# Patient Record
Sex: Male | Born: 1955
Health system: Southern US, Community
[De-identification: ages and names within clinical notes are randomized; demographics above are authoritative.]

## PROBLEM LIST (undated history)

## (undated) DIAGNOSIS — E785 Hyperlipidemia, unspecified: Secondary | ICD-10-CM

## (undated) DIAGNOSIS — M109 Gout, unspecified: Secondary | ICD-10-CM

## (undated) DIAGNOSIS — E119 Type 2 diabetes mellitus without complications: Secondary | ICD-10-CM

## (undated) DIAGNOSIS — C029 Malignant neoplasm of tongue, unspecified: Secondary | ICD-10-CM

## (undated) DIAGNOSIS — I10 Essential (primary) hypertension: Secondary | ICD-10-CM

## (undated) DIAGNOSIS — Z973 Presence of spectacles and contact lenses: Secondary | ICD-10-CM

## (undated) HISTORY — PX: TONGUE BIOPSY: SHX1075

## (undated) HISTORY — PX: KNEE ARTHROSCOPY: SHX127

## (undated) HISTORY — PX: TONSILLECTOMY: SUR1361

## (undated) HISTORY — DX: Essential (primary) hypertension: I10

## (undated) HISTORY — DX: Gout, unspecified: M10.9

## (undated) HISTORY — DX: Hyperlipidemia, unspecified: E78.5

---

## 2004-10-31 ENCOUNTER — Ambulatory Visit (HOSPITAL_BASED_OUTPATIENT_CLINIC_OR_DEPARTMENT_OTHER): Admission: RE | Admit: 2004-10-31 | Discharge: 2004-10-31 | Payer: Self-pay | Admitting: Orthopaedic Surgery

## 2004-10-31 ENCOUNTER — Ambulatory Visit (HOSPITAL_COMMUNITY): Admission: RE | Admit: 2004-10-31 | Discharge: 2004-10-31 | Payer: Self-pay | Admitting: Orthopaedic Surgery

## 2006-09-24 ENCOUNTER — Ambulatory Visit (HOSPITAL_BASED_OUTPATIENT_CLINIC_OR_DEPARTMENT_OTHER): Admission: RE | Admit: 2006-09-24 | Discharge: 2006-09-24 | Payer: Self-pay | Admitting: Orthopaedic Surgery

## 2008-12-24 ENCOUNTER — Emergency Department (HOSPITAL_BASED_OUTPATIENT_CLINIC_OR_DEPARTMENT_OTHER): Admission: EM | Admit: 2008-12-24 | Discharge: 2008-12-24 | Payer: Self-pay | Admitting: Emergency Medicine

## 2008-12-24 ENCOUNTER — Ambulatory Visit: Payer: Self-pay | Admitting: Diagnostic Radiology

## 2009-09-13 ENCOUNTER — Encounter: Admission: RE | Admit: 2009-09-13 | Discharge: 2009-09-13 | Payer: Self-pay | Admitting: Orthopaedic Surgery

## 2010-07-23 IMAGING — CR DG CHEST 2V
2 series · 2 of 2 positions shown · non-contrast
Comparison: None

CLINICAL DATA: Fever.  Hypertension.

CHEST - 2 VIEW

[w chest pa]
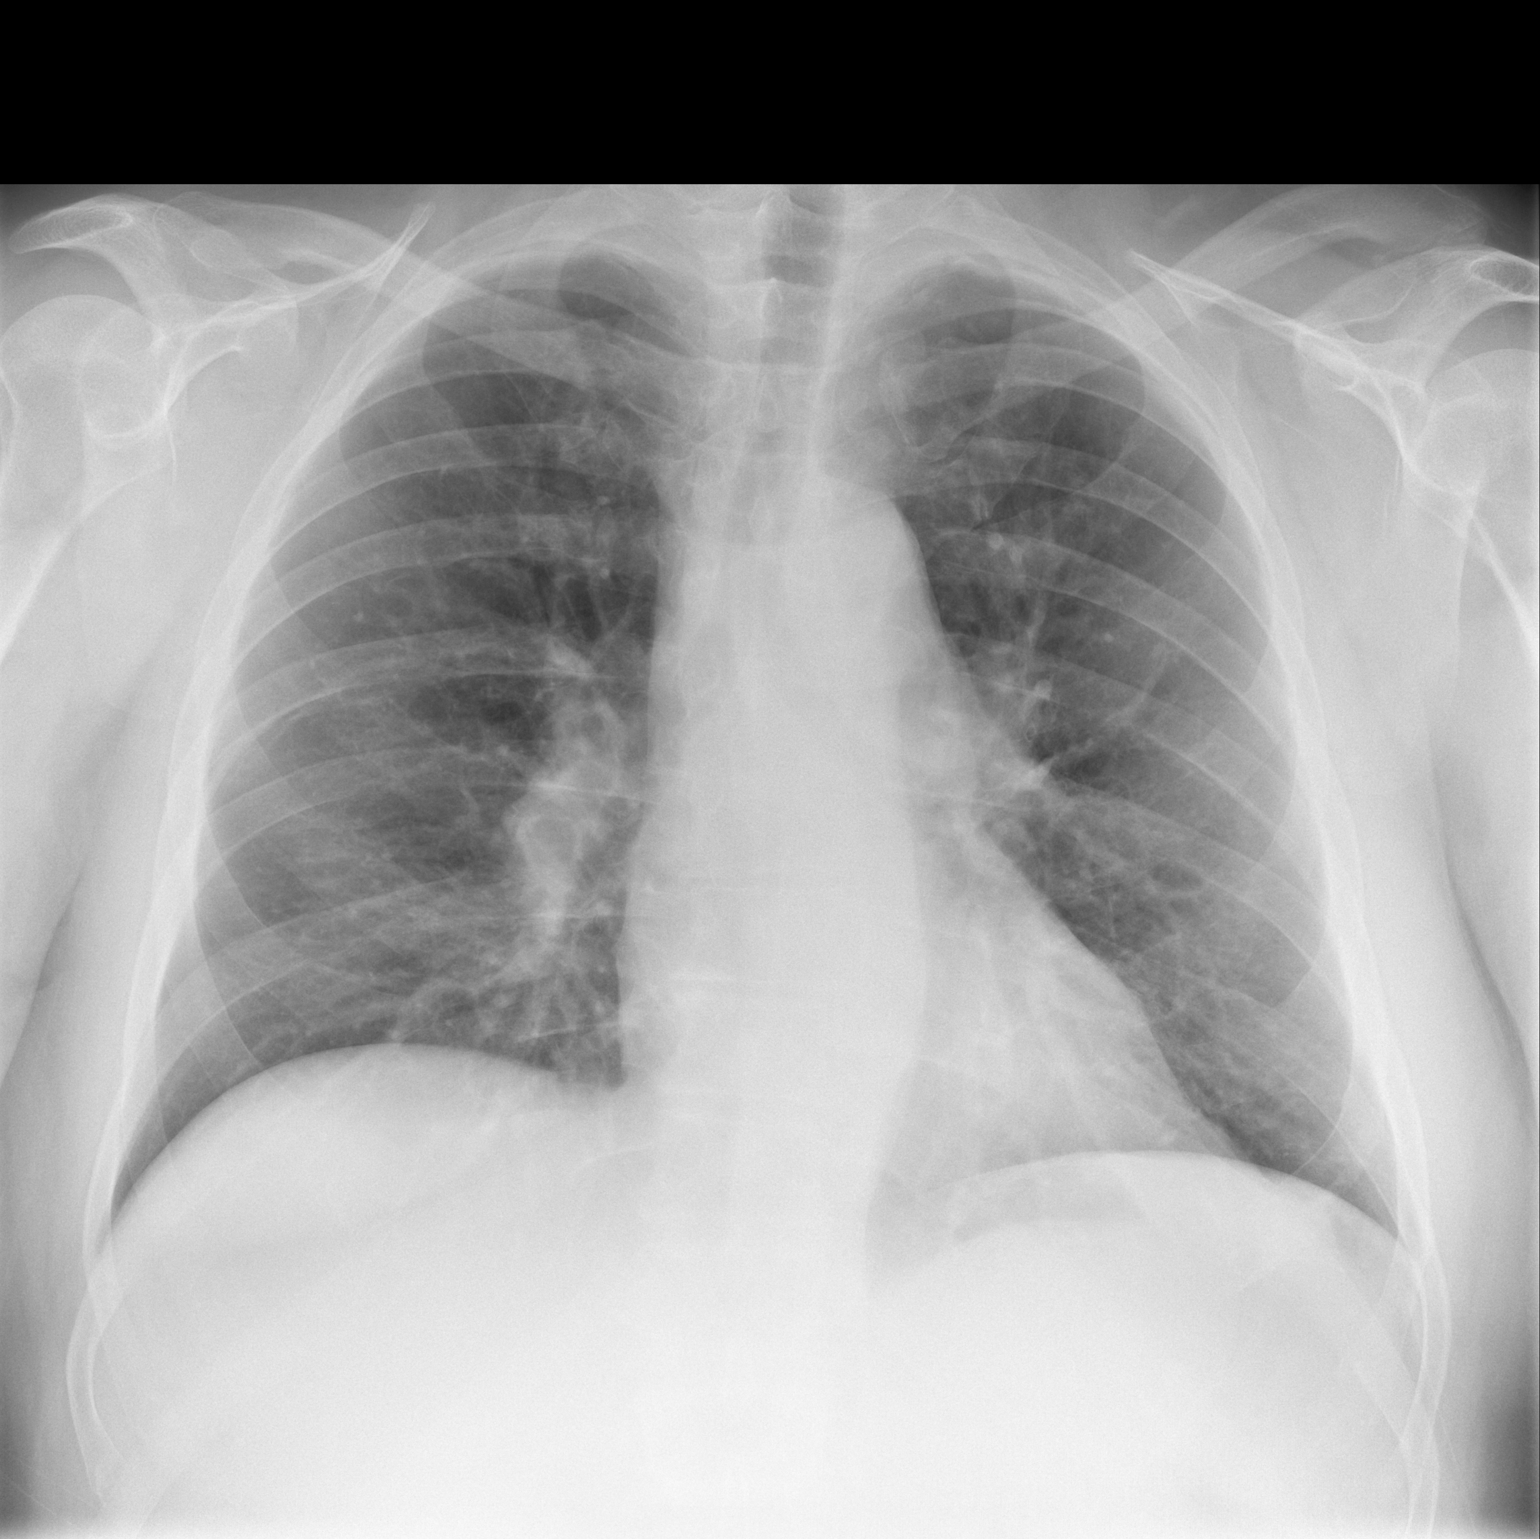

[w chest lat]
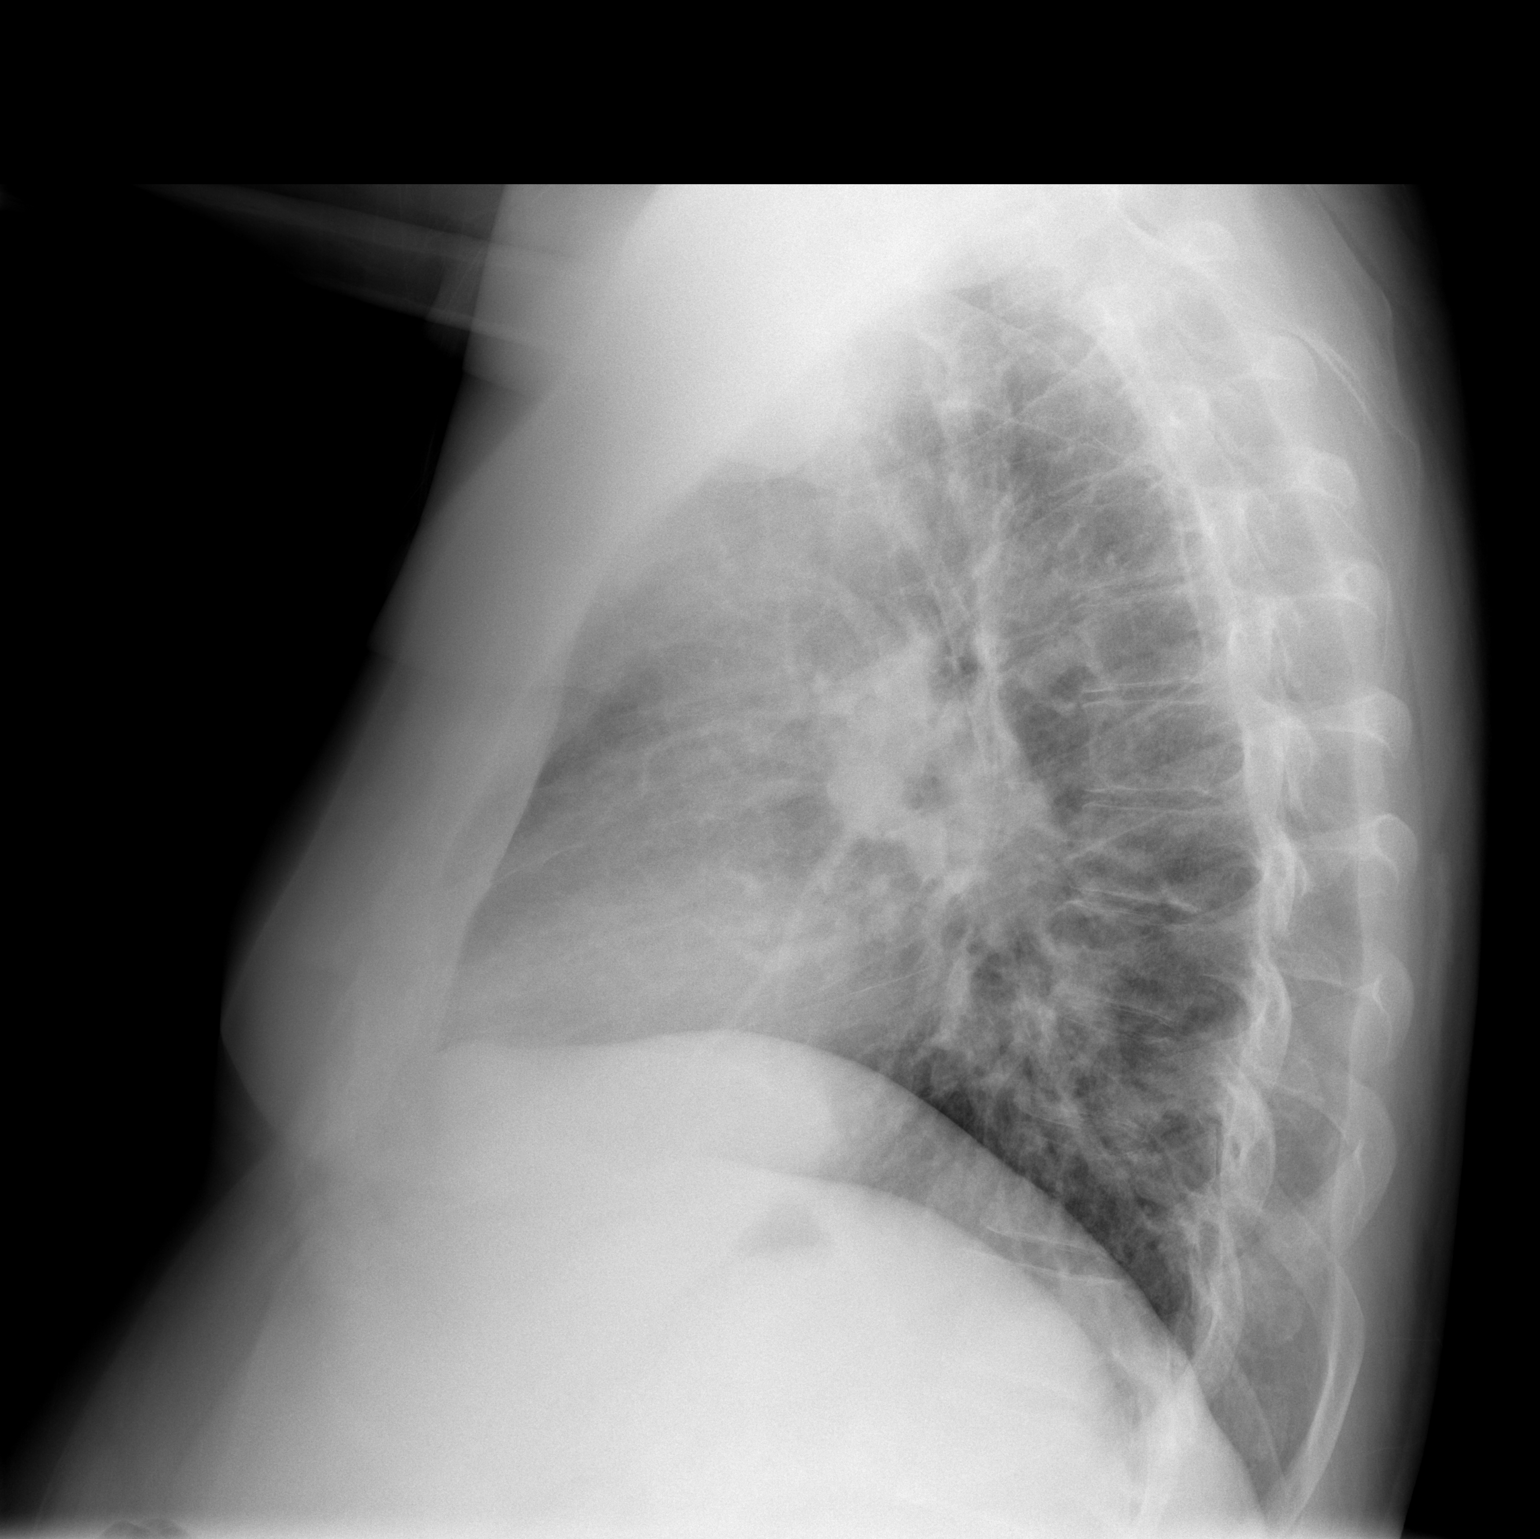

[2 of 2 positions shown; findings below may reference images not displayed]

FINDINGS: Heart size is normal.  The mediastinum is unremarkable.
Lungs are clear.  No effusions.  No significant bony finding.
IMPRESSION: Normal chest

## 2010-10-02 LAB — URINE MICROSCOPIC-ADD ON

## 2010-10-02 LAB — URINALYSIS, ROUTINE W REFLEX MICROSCOPIC
Bilirubin Urine: NEGATIVE
Glucose, UA: 100 mg/dL — AB
Nitrite: NEGATIVE
Protein, ur: 100 mg/dL — AB
Specific Gravity, Urine: 1.033 — ABNORMAL HIGH (ref 1.005–1.030)
pH: 5.5 (ref 5.0–8.0)

## 2010-10-02 LAB — BASIC METABOLIC PANEL
Glucose, Bld: 234 mg/dL — ABNORMAL HIGH (ref 70–99)
Potassium: 3.5 mEq/L (ref 3.5–5.1)
Sodium: 134 mEq/L — ABNORMAL LOW (ref 135–145)

## 2010-10-02 LAB — DIFFERENTIAL
Basophils Relative: 0 % (ref 0–1)
Lymphocytes Relative: 13 % (ref 12–46)
Lymphs Abs: 0.6 10*3/uL — ABNORMAL LOW (ref 0.7–4.0)
Monocytes Relative: 7 % (ref 3–12)
Neutro Abs: 3.2 10*3/uL (ref 1.7–7.7)

## 2010-10-02 LAB — CBC
HCT: 41.9 % (ref 39.0–52.0)
MCHC: 33.8 g/dL (ref 30.0–36.0)

## 2010-11-10 NOTE — Op Note (Signed)
[name], [name]               ACCOUNT NO.:  [id number]   MEDICAL RECORD NO.:  [id number]          PATIENT TYPE:  AMB   LOCATION:  DSC                          FACILITY:  [facility]   PHYSICIAN:  Lubertha Basque. Dalldorf, M.D.DATE OF BIRTH:  09/09/1955   DATE OF PROCEDURE:  10/31/2004  DATE OF DISCHARGE:                                 OPERATIVE REPORT   PREOPERATIVE DIAGNOSES:  1.  Right knee torn medial meniscus.  2.  Right knee degenerative joint disease.   POSTOPERATIVE DIAGNOSES:  1.  Right knee torn medial meniscus.  2.  Right knee degenerative joint disease.   PROCEDURES:  1.  Right knee partial medial and partial lateral meniscectomy.  2.  Right knee abrasion chondroplasty patellofemoral.   ANESTHESIA:  General.   ATTENDING SURGEON:  Lubertha Basque. Jerl Santos, M.D.   ASSISTANT:  Lindwood Qua, P.A.   INDICATIONS FOR PROCEDURE:  The patient is a 55 year old man with a many  year history of right knee pain. This has persisted despite oral anti-  inflammatories and rest. He  has undergone an MRI scan which shows an  obvious medial meniscus tear which is likely causing most of his trouble.  He also has some mild to moderate degenerative changes. He is offered an  arthroscopy. Informed operative consent was obtained after discussion of  possible applications of reaction to anesthesia and infection.   DESCRIPTION OF PROCEDURE:  The patient is taken to the operating suite where  general anesthetic was applied without difficulty.  He was positioned supine  and prepped and draped in normal sterile fashion. After administration of  preoperative IV antibiotics, an arthroscopy right knee was performed through  two inferior portals. Suprapatellar pouch was benign while the  patellofemoral joint exhibited some grade III change in a focal area and a  small area of grade IV change addressed with an abrasion back to bleeding  bone. A thorough chondroplasty was done of surrounding loose  articular  cartilage. The patella did appear to track normally. In the medial  compartment he had a tear of the middle horn which was flipped under and  incarcerated. This was removed, performing about a 10% partial medial  meniscectomy. There were no degenerative changes in this compartment.  Lateral compartment was notable for a slightly smaller lateral meniscus tear  addressed at the 5% partial lateral meniscectomy. There were minimal  degenerative in this compartment. The knee was thoroughly irrigated at the  end of the case, followed by placement Marcaine with epinephrine and  morphine. Adaptic was placed over the portals, followed by dry gauze and a  loose Ace wrap. Estimated blood loss and intraoperative fluids can be  obtained from anesthesia records.   DISPOSITION:  The patient was extubated in the operating room taken to the  recovery room in stable addition. Plans were for him to go home the same day  and follow up in the office in less than a week.  I will contact by phone  tonight.      PGD/MEDQ  D:  10/31/2004  T:  10/31/2004  Job:  [id number]

## 2010-11-10 NOTE — Op Note (Signed)
NAMEURBANO, MILHOUSE               ACCOUNT NO.:  [id number]   MEDICAL RECORD NO.:  [id number]          PATIENT TYPE:  AMB   LOCATION:  DSC                          FACILITY:  [facility]   PHYSICIAN:  Lubertha Basque. Dalldorf, M.D.DATE OF BIRTH:  [birth year]   DATE OF PROCEDURE:  09/24/2006  DATE OF DISCHARGE:                               OPERATIVE REPORT   PREOPERATIVE DIAGNOSES:  1. Left knee torn medial meniscus.  2. Left knee degenerative joint disease.   POSTOPERATIVE DIAGNOSES:  1. Left knee torn medial meniscus.  2. Left knee degenerative joint disease.   PROCEDURE:  1. Left knee partial medial meniscectomy.  2. Left knee abrasion chondroplasty, medial, lateral, and      patellofemoral.   ANESTHESIA:  General.   ATTENDING SURGEON:  Lubertha Basque. Jerl Santos, M.D.   ASSISTANT:  Lindwood Qua, P.A.   INDICATIONS FOR PROCEDURE:  The patient is a 55 year old man with a long  history of left knee pain and swelling.  This has persisted despite  various oral anti-inflammatories and 2 cortisone injections.  He has  undergone an MRI scan which shows things consistent with meniscal  degeneration and articular cartilage degeneration.  He has pain which  limits his ability to remain active and to rest and he is offered an  arthroscopy.  Informed operative consent was obtained after discussion  of the possible complications of reaction to anesthesia and infection.   SUMMARY OF FINDINGS AND PROCEDURE:  Under general anesthesia, an  arthroscopy of the left knee was performed.  Medial compartment  exhibited a posterior horn medial meniscus tear addressed with about a  5% partial meniscectomy back to stable tissues.  He had grade 4  degeneration across the medial femoral condyle addressed with a  chondroplasty, removing large flaps of articular cartilage.  He also had  grade 3 change on the medial tibial plateau addressed with a  chondroplasty.  ACL appeared to be intact.  Lateral compartment  exhibited again broad areas of degenerative changes with large loose  flaps of articular cartilage.  Thorough chondroplasty was done here  along with bleeding bone abrasion in several locations.  The meniscus  itself appeared intact laterally.  The patellofemoral joint also  exhibited broad areas of grade 4 change and chondroplasty was done here.   DESCRIPTION OF PROCEDURE:  The patient was taken to the operating suite,  where a general anesthetic was applied without difficulty.  He was  positioned supine and prepped and draped in the normal sterile fashion.  After the administration of preop IV Kefzol, an arthroscopy of the left  knee was performed through 2 inferior portals.  Findings were as noted  above.  The procedure consisted initially of the partial medial  meniscectomy done with basket and shaver.  We then performed a  chondroplasty of loose flaps of articular cartilage in all 3  compartments and abrasion to bleeding bone laterally and in smaller  areas in the other 2 compartments as well.  The knee was thoroughly  irrigated followed by placement of Marcaine with epinephrine and  morphine plus Depo-Medrol.  Adaptic was placed  over the portals followed  by dry gauze and a loose Ace wrap.  Estimated blood loss and fluids can  be obtained from anesthesia records.   DISPOSITION:  The patient was extubated in the operating room and taken  to the recovery room in stable addition.  He was to go home the same day  and follow up in the office in less than a week.  I will contact him by  phone tonight.      Lubertha Basque Jerl Santos, M.D.  Electronically Signed     PGD/MEDQ  D:  09/24/2006  T:  09/24/2006  Job:  [id number]

## 2013-09-02 ENCOUNTER — Ambulatory Visit: Payer: Self-pay | Admitting: Podiatry

## 2013-09-07 ENCOUNTER — Encounter: Payer: Self-pay | Admitting: Podiatry

## 2013-09-07 ENCOUNTER — Ambulatory Visit: Payer: BC Managed Care – PPO | Admitting: Podiatry

## 2013-09-07 ENCOUNTER — Ambulatory Visit (INDEPENDENT_AMBULATORY_CARE_PROVIDER_SITE_OTHER): Payer: BC Managed Care – PPO

## 2013-09-07 VITALS — BP 198/100 | HR 69 | Resp 18 | Ht 73.0 in | Wt 280.0 lb

## 2013-09-07 DIAGNOSIS — L600 Ingrowing nail: Secondary | ICD-10-CM

## 2013-09-07 DIAGNOSIS — M109 Gout, unspecified: Secondary | ICD-10-CM | POA: Insufficient documentation

## 2013-09-07 DIAGNOSIS — M79609 Pain in unspecified limb: Secondary | ICD-10-CM

## 2013-09-07 DIAGNOSIS — M2040 Other hammer toe(s) (acquired), unspecified foot: Secondary | ICD-10-CM

## 2013-09-07 DIAGNOSIS — M79606 Pain in leg, unspecified: Secondary | ICD-10-CM

## 2013-09-07 NOTE — Progress Notes (Signed)
Subjective:     Patient ID: Jose Orr, male   DOB: 1956/05/06, 58 y.o.   MRN: [id number]  HPI patient states that I have a painful nail bed left hallux and a second toe that it's been lifting up left that is red and getting irritated as time goes on   Review of Systems  All other systems reviewed and are negative.       Objective:   Physical Exam  Nursing note and vitals reviewed. Constitutional: He is oriented to person, place, and time.  Cardiovascular: Intact distal pulses.   Musculoskeletal: Normal range of motion.  Neurological: He is oriented to person, place, and time.  Skin: Skin is warm.   neurovascular status intact with muscle strength adequate and normal range of motion. Digits are well perfused with arch height normal on orthopedic exam and I noted elevation of the second toe left with rigid contracture in dorsal redness and irritation. Left hallux nail is damaged in the medial one third of the nailbed with incurvation and pain also across the dorsal surface     Assessment:     Digital hammertoe second left with moderate rigid contracture with possibility for flexor plate stretch or dislocation. Ingrown toenail deformity left hallux with damage to the underlying nail plate    Plan:     H&P and x-ray reviewed. Recommended removal of the ingrown toenail with possibility for digital fusion at one time in the future. Infiltrated the left hallux 60 mg Xylocaine Marcaine mixture remove the medial border exposed matrix and applied phenol 3 applications 30 seconds followed by alcohol lavaged and sterile dressing. Gave instructions on soaks and reappoint

## 2013-09-07 NOTE — Progress Notes (Signed)
   Subjective:    Patient ID: Jose Orr, male    DOB: September 15, 1955, 58 y.o.   MRN: [id number]  HPI N ingrown toenail        L Left 1st toenail medial border        D and O 6 months gradually worsening        C painful and encurvated toenail        A pressure, enclosed shoe        T trimming regularly, Epsom salt soaks and neosporin ointment   X-ray - for painful and curving left 2nd toe pain    Review of Systems  Skin: Positive for rash.  All other systems reviewed and are negative.       Objective:   Physical Exam        Assessment & Plan:

## 2013-09-07 NOTE — Patient Instructions (Signed)

## 2013-09-10 ENCOUNTER — Ambulatory Visit: Payer: Self-pay | Admitting: Podiatry

## 2013-10-06 ENCOUNTER — Other Ambulatory Visit (INDEPENDENT_AMBULATORY_CARE_PROVIDER_SITE_OTHER): Payer: Self-pay | Admitting: Otolaryngology

## 2013-10-06 DIAGNOSIS — C029 Malignant neoplasm of tongue, unspecified: Secondary | ICD-10-CM

## 2013-10-09 ENCOUNTER — Encounter (HOSPITAL_COMMUNITY)
Admission: RE | Admit: 2013-10-09 | Discharge: 2013-10-09 | Disposition: A | Discharge status: Home / Self Care | Payer: BC Managed Care – PPO | Source: Ambulatory Visit | Attending: Otolaryngology | Admitting: Otolaryngology

## 2013-10-09 ENCOUNTER — Other Ambulatory Visit (INDEPENDENT_AMBULATORY_CARE_PROVIDER_SITE_OTHER): Payer: Self-pay | Admitting: Otolaryngology

## 2013-10-09 ENCOUNTER — Encounter (HOSPITAL_COMMUNITY): Payer: Self-pay

## 2013-10-09 DIAGNOSIS — C029 Malignant neoplasm of tongue, unspecified: Secondary | ICD-10-CM

## 2013-10-09 LAB — GLUCOSE, CAPILLARY: Glucose-Capillary: 132 mg/dL — ABNORMAL HIGH (ref 70–99)

## 2013-10-09 MED ORDER — FLUDEOXYGLUCOSE F - 18 (FDG) INJECTION
14.8000 | Freq: Once | INTRAVENOUS | Status: AC | PRN
  Administered 2013-10-09: 14.8 via INTRAVENOUS

## 2013-10-19 ENCOUNTER — Other Ambulatory Visit: Payer: Self-pay | Admitting: Otolaryngology

## 2013-10-20 ENCOUNTER — Encounter (HOSPITAL_BASED_OUTPATIENT_CLINIC_OR_DEPARTMENT_OTHER): Payer: Self-pay | Admitting: *Deleted

## 2013-10-20 NOTE — Progress Notes (Signed)
10/20/13 1521  OBSTRUCTIVE SLEEP APNEA  Have you ever been diagnosed with sleep apnea through a sleep study? No  Do you snore loudly (loud enough to be heard through closed doors)?  1  Do you often feel tired, fatigued, or sleepy during the daytime? 0  Has anyone observed you stop breathing during your sleep? 0  Do you have, or are you being treated for high blood pressure? 1  BMI more than 35 kg/m2? 1  Age over 60 years old? 1  Neck circumference greater than 40 cm/16 inches? 1  Gender: 1  Obstructive Sleep Apnea Score 6  Score 4 or greater  Results sent to PCP

## 2013-10-20 NOTE — Progress Notes (Signed)
Will come in for ekg-bmet-has been here x 2 for knee surgery

## 2013-10-23 ENCOUNTER — Other Ambulatory Visit: Payer: Self-pay

## 2013-10-23 ENCOUNTER — Encounter (HOSPITAL_BASED_OUTPATIENT_CLINIC_OR_DEPARTMENT_OTHER)
Admission: RE | Admit: 2013-10-23 | Discharge: 2013-10-23 | Disposition: A | Discharge status: Home / Self Care | Payer: BC Managed Care – PPO | Source: Ambulatory Visit | Attending: Otolaryngology | Admitting: Otolaryngology

## 2013-10-23 DIAGNOSIS — Z01810 Encounter for preprocedural cardiovascular examination: Secondary | ICD-10-CM | POA: Insufficient documentation

## 2013-10-23 LAB — BASIC METABOLIC PANEL
BUN: 22 mg/dL (ref 6–23)
CHLORIDE: 101 meq/L (ref 96–112)
CO2: 27 meq/L (ref 19–32)
Calcium: 9.6 mg/dL (ref 8.4–10.5)
Creatinine, Ser: 0.85 mg/dL (ref 0.50–1.35)
GFR calc Af Amer: >90 mL/min (ref >90)
GFR calc non Af Amer: >90 mL/min (ref >90)
GLUCOSE: 138 mg/dL — AB (ref 70–99)
POTASSIUM: 4.5 meq/L (ref 3.7–5.3)
SODIUM: 142 meq/L (ref 137–147)

## 2013-10-27 ENCOUNTER — Encounter (HOSPITAL_BASED_OUTPATIENT_CLINIC_OR_DEPARTMENT_OTHER): Payer: Self-pay

## 2013-10-27 ENCOUNTER — Encounter (HOSPITAL_BASED_OUTPATIENT_CLINIC_OR_DEPARTMENT_OTHER)
Admission: RE | Disposition: A | Discharge status: Home / Self Care | Payer: Self-pay | Source: Ambulatory Visit | Attending: Otolaryngology

## 2013-10-27 ENCOUNTER — Ambulatory Visit (HOSPITAL_BASED_OUTPATIENT_CLINIC_OR_DEPARTMENT_OTHER): Payer: BC Managed Care – PPO | Admitting: Anesthesiology

## 2013-10-27 ENCOUNTER — Encounter (HOSPITAL_BASED_OUTPATIENT_CLINIC_OR_DEPARTMENT_OTHER): Payer: BC Managed Care – PPO | Admitting: Anesthesiology

## 2013-10-27 ENCOUNTER — Ambulatory Visit (HOSPITAL_BASED_OUTPATIENT_CLINIC_OR_DEPARTMENT_OTHER)
Admission: RE | Admit: 2013-10-27 | Discharge: 2013-10-27 | Disposition: A | Discharge status: Home / Self Care | Payer: BC Managed Care – PPO | Source: Ambulatory Visit | Attending: Otolaryngology | Admitting: Otolaryngology

## 2013-10-27 DIAGNOSIS — C01 Malignant neoplasm of base of tongue: Secondary | ICD-10-CM

## 2013-10-27 DIAGNOSIS — C029 Malignant neoplasm of tongue, unspecified: Secondary | ICD-10-CM | POA: Insufficient documentation

## 2013-10-27 HISTORY — DX: Presence of spectacles and contact lenses: Z97.3

## 2013-10-27 HISTORY — PX: EXCISION OF TONGUE LESION: SHX6434

## 2013-10-27 LAB — POCT HEMOGLOBIN-HEMACUE: HEMOGLOBIN: 15.1 g/dL (ref 13.0–17.0)

## 2013-10-27 SURGERY — EXCISION, LESION, TONGUE
Anesthesia: General | Site: Mouth | Laterality: Left

## 2013-10-27 MED ORDER — LIDOCAINE-EPINEPHRINE 1 %-1:100000 IJ SOLN
INTRAMUSCULAR | Status: DC | PRN
  Administered 2013-10-27: 3.5 mL

## 2013-10-27 MED ORDER — CEFAZOLIN SODIUM-DEXTROSE 2-3 GM-% IV SOLR
INTRAVENOUS | Status: DC | PRN
  Administered 2013-10-27: 2 g via INTRAVENOUS

## 2013-10-27 MED ORDER — ONDANSETRON HCL 4 MG/2ML IJ SOLN
INTRAMUSCULAR | Status: DC | PRN
  Administered 2013-10-27: 4 mg via INTRAVENOUS

## 2013-10-27 MED ORDER — PROPOFOL INFUSION 10 MG/ML OPTIME
INTRAVENOUS | Status: DC | PRN
  Administered 2013-10-27: 100 ug/kg/min via INTRAVENOUS

## 2013-10-27 MED ORDER — LIDOCAINE HCL (CARDIAC) 20 MG/ML IV SOLN
INTRAVENOUS | Status: DC | PRN
  Administered 2013-10-27: 60 mg via INTRAVENOUS

## 2013-10-27 MED ORDER — BACITRACIN-NEOMYCIN-POLYMYXIN 400-5-5000 EX OINT
TOPICAL_OINTMENT | CUTANEOUS | Status: AC
  Filled 2013-10-27: qty 1

## 2013-10-27 MED ORDER — FENTANYL CITRATE 0.05 MG/ML IJ SOLN
INTRAMUSCULAR | Status: DC | PRN
  Administered 2013-10-27: 50 ug via INTRAVENOUS
  Administered 2013-10-27: 100 ug via INTRAVENOUS
  Administered 2013-10-27 (×2): 50 ug via INTRAVENOUS
  Administered 2013-10-27 (×2): 25 ug via INTRAVENOUS

## 2013-10-27 MED ORDER — MIDAZOLAM HCL 2 MG/2ML IJ SOLN: 1.0000 mg | INTRAMUSCULAR | Status: DC | PRN

## 2013-10-27 MED ORDER — HYDROMORPHONE HCL PF 1 MG/ML IJ SOLN
INTRAMUSCULAR | Status: AC
  Filled 2013-10-27: qty 1

## 2013-10-27 MED ORDER — MIDAZOLAM HCL 2 MG/2ML IJ SOLN
INTRAMUSCULAR | Status: AC
  Filled 2013-10-27: qty 2

## 2013-10-27 MED ORDER — OXYCODONE HCL 5 MG/5ML PO SOLN
ORAL | Status: AC
  Filled 2013-10-27: qty 5

## 2013-10-27 MED ORDER — PROPOFOL 10 MG/ML IV BOLUS
INTRAVENOUS | Status: DC | PRN
  Administered 2013-10-27: 200 mg via INTRAVENOUS
  Administered 2013-10-27: 50 mg via INTRAVENOUS

## 2013-10-27 MED ORDER — LACTATED RINGERS IV SOLN
INTRAVENOUS | Status: DC
  Administered 2013-10-27: 10 mL/h via INTRAVENOUS
  Administered 2013-10-27 (×2): via INTRAVENOUS

## 2013-10-27 MED ORDER — LIDOCAINE-EPINEPHRINE 1 %-1:100000 IJ SOLN
INTRAMUSCULAR | Status: AC
  Filled 2013-10-27: qty 1

## 2013-10-27 MED ORDER — PROPOFOL 10 MG/ML IV BOLUS
INTRAVENOUS | Status: AC
  Filled 2013-10-27: qty 60

## 2013-10-27 MED ORDER — SUCCINYLCHOLINE CHLORIDE 20 MG/ML IJ SOLN
INTRAMUSCULAR | Status: DC | PRN
  Administered 2013-10-27: 100 mg via INTRAVENOUS

## 2013-10-27 MED ORDER — OXYCODONE HCL 5 MG/5ML PO SOLN
5.0000 mg | Freq: Once | ORAL | Status: AC | PRN
  Administered 2013-10-27: 5 mg via ORAL

## 2013-10-27 MED ORDER — DEXAMETHASONE SODIUM PHOSPHATE 4 MG/ML IJ SOLN
INTRAMUSCULAR | Status: DC | PRN
  Administered 2013-10-27: 10 mg via INTRAVENOUS

## 2013-10-27 MED ORDER — ONDANSETRON HCL 4 MG/2ML IJ SOLN: 4.0000 mg | Freq: Once | INTRAMUSCULAR | Status: DC | PRN

## 2013-10-27 MED ORDER — OXYCODONE-ACETAMINOPHEN 5-325 MG PO TABS: 1.0000 | ORAL_TABLET | ORAL | Status: DC | PRN

## 2013-10-27 MED ORDER — PROPOFOL 10 MG/ML IV BOLUS
INTRAVENOUS | Status: AC
  Filled 2013-10-27: qty 20

## 2013-10-27 MED ORDER — OXYCODONE HCL 5 MG PO TABS: 5.0000 mg | ORAL_TABLET | Freq: Once | ORAL | Status: AC | PRN

## 2013-10-27 MED ORDER — FENTANYL CITRATE 0.05 MG/ML IJ SOLN: 50.0000 ug | INTRAMUSCULAR | Status: DC | PRN

## 2013-10-27 MED ORDER — HYDROMORPHONE HCL PF 1 MG/ML IJ SOLN
0.2500 mg | INTRAMUSCULAR | Status: DC | PRN
  Administered 2013-10-27 (×4): 0.5 mg via INTRAVENOUS

## 2013-10-27 MED ORDER — OXYMETAZOLINE HCL 0.05 % NA SOLN
NASAL | Status: DC | PRN
  Administered 2013-10-27: 1

## 2013-10-27 MED ORDER — MIDAZOLAM HCL 5 MG/5ML IJ SOLN
INTRAMUSCULAR | Status: DC | PRN
  Administered 2013-10-27: 2 mg via INTRAVENOUS

## 2013-10-27 MED ORDER — FENTANYL CITRATE 0.05 MG/ML IJ SOLN
INTRAMUSCULAR | Status: AC
  Filled 2013-10-27: qty 6

## 2013-10-27 MED ORDER — BACITRACIN 500 UNIT/GM EX OINT
TOPICAL_OINTMENT | CUTANEOUS | Status: DC | PRN
  Administered 2013-10-27: 1 via TOPICAL

## 2013-10-27 SURGICAL SUPPLY — 32 items
BLADE 15 SAFETY STRL DISP (BLADE) ×2 IMPLANT
CANISTER SUCT 1200ML W/VALVE (MISCELLANEOUS) ×2 IMPLANT
COAGULATOR SUCT 8FR VV (MISCELLANEOUS) ×2 IMPLANT
COVER MAYO STAND STRL (DRAPES) ×2 IMPLANT
DECANTER SPIKE VIAL GLASS SM (MISCELLANEOUS) IMPLANT
DEPRESSOR TONGUE BLADE STERILE (MISCELLANEOUS) IMPLANT
ELECT COATED BLADE 2.86 ST (ELECTRODE) ×2 IMPLANT
ELECT NEEDLE BLADE 2-5/6 (NEEDLE) IMPLANT
ELECT REM PT RETURN 9FT ADLT (ELECTROSURGICAL) ×2
ELECT REM PT RETURN 9FT PED (ELECTROSURGICAL)
ELECTRODE REM PT RETRN 9FT PED (ELECTROSURGICAL) IMPLANT
ELECTRODE REM PT RTRN 9FT ADLT (ELECTROSURGICAL) ×1 IMPLANT
GLOVE BIO SURGEON STRL SZ7.5 (GLOVE) ×4 IMPLANT
GLOVE SURG SS PI 7.0 STRL IVOR (GLOVE) ×4 IMPLANT
GOWN STRL REUS W/ TWL LRG LVL3 (GOWN DISPOSABLE) ×3 IMPLANT
GOWN STRL REUS W/TWL LRG LVL3 (GOWN DISPOSABLE) ×3
NEEDLE 27GAX1X1/2 (NEEDLE) ×2 IMPLANT
PACK BASIN DAY SURGERY FS (CUSTOM PROCEDURE TRAY) ×2 IMPLANT
PENCIL BUTTON HOLSTER BLD 10FT (ELECTRODE) ×2 IMPLANT
SHEET MEDIUM DRAPE 40X70 STRL (DRAPES) ×2 IMPLANT
SUCTION FRAZIER TIP 10 FR DISP (SUCTIONS) IMPLANT
SUT CHROMIC 5 0 RB 1 27 (SUTURE) IMPLANT
SUT SILK 3 0 TIES 17X18 (SUTURE)
SUT SILK 3-0 18XBRD TIE BLK (SUTURE) IMPLANT
SUT VIC AB 4-0 RB1 27 (SUTURE)
SUT VIC AB 4-0 RB1 27X BRD (SUTURE) IMPLANT
SUT VIC AB 4-0 SH 27 (SUTURE) ×1
SUT VIC AB 4-0 SH 27XANBCTRL (SUTURE) ×1 IMPLANT
SYR CONTROL 10ML LL (SYRINGE) ×2 IMPLANT
TOWEL OR 17X24 6PK STRL BLUE (TOWEL DISPOSABLE) ×2 IMPLANT
TUBE CONNECTING 20X1/4 (TUBING) ×2 IMPLANT
YANKAUER SUCT BULB TIP NO VENT (SUCTIONS) IMPLANT

## 2013-10-27 NOTE — Discharge Instructions (Addendum)
Follow up with Dr. Benjamine Mola in 1 week.  Post Anesthesia Home Care Instructions  Activity: Get plenty of rest for the remainder of the day. A responsible adult should stay with you for 24 hours following the procedure.  For the next 24 hours, DO NOT: -Drive a car -Paediatric nurse -Drink alcoholic beverages -Take any medication unless instructed by your physician -Make any legal decisions or sign important papers.  Meals: Start with liquid foods such as gelatin or soup. Progress to regular foods as tolerated. Avoid greasy, spicy, heavy foods. If nausea and/or vomiting occur, drink only clear liquids until the nausea and/or vomiting subsides. Call your physician if vomiting continues.  Special Instructions/Symptoms: Your throat may feel dry or sore from the anesthesia or the breathing tube placed in your throat during surgery. If this causes discomfort, gargle with warm salt water. The discomfort should disappear within 24 hours.   Call your surgeon if you experience:   1.  Fever over 101.0. 2.  Inability to urinate. 3.  Nausea and/or vomiting. 4.  Extreme swelling or bruising at the surgical site. 5.  Continued bleeding from the incision. 6.  Increased pain, redness or drainage from the incision. 7.  Problems related to your pain medication.

## 2013-10-27 NOTE — Transfer of Care (Signed)
Immediate Anesthesia Transfer of Care Note  Patient: Jose Orr  Procedure(s) Performed: Procedure(s): LEFT PARTIAL GLOSSECTOMY (Left)  Patient Location: PACU  Anesthesia Type:General  Level of Consciousness: awake, alert  and oriented  Airway & Oxygen Therapy: Patient Spontanous Breathing and Patient connected to face mask oxygen  Post-op Assessment: Report given to PACU RN and Post -op Vital signs reviewed and stable  Post vital signs: Reviewed and stable  Complications: No apparent anesthesia complications

## 2013-10-27 NOTE — H&P (Signed)
  H&P Update  Pt's original H&P dated 10/05/13 reviewed and placed in chart (to be scanned).  I personally examined the patient today.  No change in health. Proceed with partial left glossectomy.

## 2013-10-27 NOTE — Brief Op Note (Signed)
10/27/2013  11:56 AM  PATIENT:  Arloa Koh  58 y.o. male  PRE-OPERATIVE DIAGNOSIS:  SQUAMOUS CELL CARCINOMA OF TONGUE  POST-OPERATIVE DIAGNOSIS:  SQUAMOUS CELL CARCINOMA OF TONGUE  PROCEDURE:  Procedure(s): LEFT PARTIAL GLOSSECTOMY (Left)  SURGEON:  Surgeon(s) and Role:    * Ascencion Dike, MD - Primary  PHYSICIAN ASSISTANT:   ASSISTANTS: none   ANESTHESIA:   general  EBL:  Total I/O In: 1800 [I.V.:1800] Out: -   BLOOD ADMINISTERED:none  DRAINS: none   LOCAL MEDICATIONS USED:  LIDOCAINE   SPECIMEN:  Source of Specimen:  Left partial glossectomy  DISPOSITION OF SPECIMEN:  PATHOLOGY  COUNTS:  YES  TOURNIQUET:  * No tourniquets in log *  DICTATION: .Other Dictation: Dictation Number [id number]  PLAN OF CARE: Discharge to home after PACU  PATIENT DISPOSITION:  PACU - hemodynamically stable.   Delay start of Pharmacological VTE agent (>24hrs) due to surgical blood loss or risk of bleeding: not applicable

## 2013-10-27 NOTE — Anesthesia Procedure Notes (Signed)
Procedure Name: Intubation Date/Time: 10/27/2013 9:54 AM Performed by: Maryella Shivers Pre-anesthesia Checklist: Patient identified, Emergency Drugs available, Suction available and Patient being monitored Patient Re-evaluated:Patient Re-evaluated prior to inductionOxygen Delivery Method: Circle System Utilized Preoxygenation: Pre-oxygenation with 100% oxygen Intubation Type: IV induction Ventilation: Mask ventilation without difficulty Laryngoscope Size: Mac and 3 Grade View: Grade I Tube type: Oral Tube size: 8.0 mm Number of attempts: 1 Airway Equipment and Method: stylet and oral airway Placement Confirmation: ETT inserted through vocal cords under direct vision,  positive ETCO2 and breath sounds checked- equal and bilateral Secured at: 21 cm Tube secured with: Tape Dental Injury: Teeth and Oropharynx as per pre-operative assessment

## 2013-10-27 NOTE — Transfer of Care (Deleted)
Immediate Anesthesia Transfer of Care Note  Patient: Jose Orr  Procedure(s) Performed: Procedure(s): LEFT PARTIAL GLOSSECTOMY (Left)  Patient Location: PACU  Anesthesia Type:General  Level of Consciousness: awake, alert  and oriented  Airway & Oxygen Therapy: Patient Spontanous Breathing and Patient connected to face mask oxygen  Post-op Assessment: Report given to PACU RN and Post -op Vital signs reviewed and stable  Post vital signs: Reviewed and stable  Complications: No apparent anesthesia complications 

## 2013-10-27 NOTE — Anesthesia Postprocedure Evaluation (Signed)
  Anesthesia Post-op Note  Patient: Jose Orr  Procedure(s) Performed: Procedure(s): LEFT PARTIAL GLOSSECTOMY (Left)  Patient Location: PACU  Anesthesia Type:General  Level of Consciousness: awake, alert  and oriented  Airway and Oxygen Therapy: Patient Spontanous Breathing and Patient connected to nasal cannula oxygen  Post-op Pain: mild  Post-op Assessment: Post-op Vital signs reviewed, Patient's Cardiovascular Status Stable, Respiratory Function Stable, Patent Airway and Pain level controlled  Post-op Vital Signs: stable  Last Vitals:  Filed Vitals:   10/27/13 1315  BP: 157/91  Pulse:   Temp:   Resp:     Complications: No apparent anesthesia complications

## 2013-10-27 NOTE — Anesthesia Preprocedure Evaluation (Signed)
Anesthesia Evaluation  Patient identified by MRN, date of birth, ID band Patient awake    Reviewed: Allergy & Precautions, H&P , NPO status , Patient's Chart, lab work & pertinent test results  Airway Mallampati: II TM Distance: >3 FB Neck ROM: Full    Dental  (+) Teeth Intact, Dental Advisory Given   Pulmonary  breath sounds clear to auscultation        Cardiovascular hypertension, Rhythm:Regular Rate:Normal     Neuro/Psych    GI/Hepatic   Endo/Other    Renal/GU      Musculoskeletal   Abdominal   Peds  Hematology   Anesthesia Other Findings   Reproductive/Obstetrics                           Anesthesia Physical Anesthesia Plan  ASA: III  Anesthesia Plan: General   Post-op Pain Management:    Induction: Intravenous  Airway Management Planned: Oral ETT  Additional Equipment:   Intra-op Plan:   Post-operative Plan:   Informed Consent: I have reviewed the patients History and Physical, chart, labs and discussed the procedure including the risks, benefits and alternatives for the proposed anesthesia with the patient or authorized representative who has indicated his/her understanding and acceptance.   Dental advisory given  Plan Discussed with: CRNA and Anesthesiologist  Anesthesia Plan Comments: (SCCa Tomgue Htn Obesity  Plan GA with oral Ett  Roberts Gaudy)        Anesthesia Quick Evaluation

## 2013-10-28 ENCOUNTER — Encounter (HOSPITAL_BASED_OUTPATIENT_CLINIC_OR_DEPARTMENT_OTHER): Payer: Self-pay | Admitting: Otolaryngology

## 2013-10-28 SURGERY — Surgical Case
Anesthesia: *Unknown

## 2013-10-28 NOTE — Op Note (Signed)
Jose Orr, Jose Orr               ACCOUNT NO.:  [id number]  MEDICAL RECORD NO.:  [id number]  LOCATION:                                 FACILITY:  PHYSICIAN:  Leta Baptist, MD            DATE OF BIRTH:  1956-05-07  DATE OF PROCEDURE:  10/27/2013 DATE OF DISCHARGE:  10/27/2013                              OPERATIVE REPORT   SURGEON:  Leta Baptist, MD  PREOPERATIVE DIAGNOSIS:  Left lateral tongue squamous cell carcinoma.  POSTOPERATIVE DIAGNOSIS:  Left lateral tongue squamous cell carcinoma.  PROCEDURE PERFORMED:  Left partial glossectomy.  ANESTHESIA:  General endotracheal tube anesthesia.  COMPLICATIONS:  None.  ESTIMATED BLOOD LOSS:  Less than 20 mL.  INDICATION FOR PROCEDURE:  The patient is a 58 year old male with a history of left lateral tongue squamous cell carcinoma.  The lesion was removed by Dr. Benson Norway.  However, the margin of the specimen was positive for squamous cell carcinoma.  As a result, the decision was made to proceed with wide excision of the tumor side, with a frozen section analysis of the margins.  The risks, benefits, alternatives, and details of the procedure were discussed with the patient.  Questions were invited and answered.  Informed consent was obtained.  DESCRIPTION:  The patient was taken to the operating room and placed supine on the operating table.  General endotracheal tube anesthesia was administered by the anesthesiologist.  The patient was positioned and prepped and draped in a standard fashion for left partial glossectomy. A 1% lidocaine with 1:100,000 epinephrine was infiltrated around the left lateral tongue.  A fibrotic scarring area was noted at the left lateral tongue.  The abnormal area measured approximately 2 x 2 cm. Once adequate local anesthesia was achieved, a 4 x 3.5 cm partial glossectomy specimen was resected with a 15 blade.  The resected specimen compressed the entire fibrotic scarring area as well as the surrounding normal  tongue tissue.  The dissection was carried down to the deeper layer of the tongue muscles, until normal musculatures were noted.  The entire specimen was then divided into 4 quadrants, anterior- superior, anterior- inferior, posterior-superior, and posterior- inferior.  The 4 specimens were sent to the Pathology Department for frozen section analysis.  The results were all negative for squamous cell carcinoma.  The surgical site was copiously irrigated.  Hemostasis was achieved with suction electrocautery device.  The surgical site was closed with interrupted 4-0 Vicryl sutures.  The care of the patient was turned over to the anesthesiologist.  The patient was awakened from anesthesia without difficulty.  He was extubated and transferred to the recovery room in good condition.  OPERATIVE FINDINGS:  Fibrotic scarring area was noted at the left lateral tongue.  A 4 x 3.5 cm specimen was excised, encompassing fibrotic scarring area.  The pathology was benign for any residual squamous cell carcinoma.  SPECIMENS:  Left lateral tongue partial glossectomy specimen.  FOLLOWUP CARE:  The patient will be discharged home once he is awake and alert.  He will be placed on Percocet p.r.n. pain.  The patient will follow up in my office in 1 week.  Leta Baptist, MD   ______________________________ Leta Baptist, MD    ST/MEDQ  D:  10/27/2013  T:  10/28/2013  Job:  [id number]

## 2015-09-28 DIAGNOSIS — C029 Malignant neoplasm of tongue, unspecified: Secondary | ICD-10-CM | POA: Diagnosis not present

## 2015-09-29 ENCOUNTER — Other Ambulatory Visit (INDEPENDENT_AMBULATORY_CARE_PROVIDER_SITE_OTHER): Payer: Self-pay | Admitting: Otolaryngology

## 2015-09-29 ENCOUNTER — Other Ambulatory Visit: Payer: Self-pay | Admitting: Otolaryngology

## 2015-09-29 DIAGNOSIS — C029 Malignant neoplasm of tongue, unspecified: Secondary | ICD-10-CM

## 2015-10-04 ENCOUNTER — Other Ambulatory Visit: Payer: Self-pay

## 2015-10-10 ENCOUNTER — Ambulatory Visit
Admission: RE | Admit: 2015-10-10 | Discharge: 2015-10-10 | Disposition: A | Discharge status: Home / Self Care | Payer: BLUE CROSS/BLUE SHIELD | Source: Ambulatory Visit | Attending: Otolaryngology | Admitting: Otolaryngology

## 2015-10-10 DIAGNOSIS — C029 Malignant neoplasm of tongue, unspecified: Secondary | ICD-10-CM

## 2015-10-10 DIAGNOSIS — Z85810 Personal history of malignant neoplasm of tongue: Secondary | ICD-10-CM | POA: Diagnosis not present

## 2015-10-10 MED ORDER — GADOBENATE DIMEGLUMINE 529 MG/ML IV SOLN
20.0000 mL | Freq: Once | INTRAVENOUS | Status: AC | PRN
  Administered 2015-10-10: 20 mL via INTRAVENOUS

## 2015-10-18 ENCOUNTER — Encounter (HOSPITAL_BASED_OUTPATIENT_CLINIC_OR_DEPARTMENT_OTHER): Payer: Self-pay | Admitting: *Deleted

## 2015-10-18 NOTE — Progress Notes (Signed)
   10/18/15 1543  OBSTRUCTIVE SLEEP APNEA  Have you ever been diagnosed with sleep apnea through a sleep study? No  Do you snore loudly (loud enough to be heard through closed doors)?  1  Do you often feel tired, fatigued, or sleepy during the daytime (such as falling asleep during driving or talking to someone)? 0  Has anyone observed you stop breathing during your sleep? 0  Do you have, or are you being treated for high blood pressure? 0  BMI more than 35 kg/m2? 1  Age > 60 (1-yes) 1  Male Gender (Yes=1) 1  Obstructive Sleep Apnea Score 4

## 2015-10-20 ENCOUNTER — Other Ambulatory Visit: Payer: Self-pay

## 2015-10-20 ENCOUNTER — Encounter (HOSPITAL_BASED_OUTPATIENT_CLINIC_OR_DEPARTMENT_OTHER)
Admission: RE | Admit: 2015-10-20 | Discharge: 2015-10-20 | Disposition: A | Discharge status: Home / Self Care | Payer: BLUE CROSS/BLUE SHIELD | Source: Ambulatory Visit | Attending: Otolaryngology | Admitting: Otolaryngology

## 2015-10-20 DIAGNOSIS — I10 Essential (primary) hypertension: Secondary | ICD-10-CM | POA: Diagnosis not present

## 2015-10-20 DIAGNOSIS — Z01810 Encounter for preprocedural cardiovascular examination: Secondary | ICD-10-CM | POA: Insufficient documentation

## 2015-10-20 DIAGNOSIS — C029 Malignant neoplasm of tongue, unspecified: Secondary | ICD-10-CM | POA: Diagnosis not present

## 2015-10-20 DIAGNOSIS — Z79899 Other long term (current) drug therapy: Secondary | ICD-10-CM | POA: Diagnosis not present

## 2015-10-20 LAB — BASIC METABOLIC PANEL
ANION GAP: 11 (ref 5–15)
BUN: 20 mg/dL (ref 6–20)
CALCIUM: 9.9 mg/dL (ref 8.9–10.3)
CO2: 26 mmol/L (ref 22–32)
Chloride: 102 mmol/L (ref 101–111)
Creatinine, Ser: 0.84 mg/dL (ref 0.61–1.24)
Glucose, Bld: 124 mg/dL — ABNORMAL HIGH (ref 65–99)
Potassium: 3.5 mmol/L (ref 3.5–5.1)
Sodium: 139 mmol/L (ref 135–145)

## 2015-10-25 ENCOUNTER — Encounter (HOSPITAL_BASED_OUTPATIENT_CLINIC_OR_DEPARTMENT_OTHER)
Admission: RE | Disposition: A | Discharge status: Home / Self Care | Payer: Self-pay | Source: Ambulatory Visit | Attending: Otolaryngology

## 2015-10-25 ENCOUNTER — Ambulatory Visit (HOSPITAL_BASED_OUTPATIENT_CLINIC_OR_DEPARTMENT_OTHER): Payer: BLUE CROSS/BLUE SHIELD | Admitting: Anesthesiology

## 2015-10-25 ENCOUNTER — Encounter (HOSPITAL_BASED_OUTPATIENT_CLINIC_OR_DEPARTMENT_OTHER): Payer: Self-pay | Admitting: Anesthesiology

## 2015-10-25 ENCOUNTER — Ambulatory Visit (HOSPITAL_BASED_OUTPATIENT_CLINIC_OR_DEPARTMENT_OTHER)
Admission: RE | Admit: 2015-10-25 | Discharge: 2015-10-25 | Disposition: A | Discharge status: Home / Self Care | Payer: BLUE CROSS/BLUE SHIELD | Source: Ambulatory Visit | Attending: Otolaryngology | Admitting: Otolaryngology

## 2015-10-25 DIAGNOSIS — C029 Malignant neoplasm of tongue, unspecified: Secondary | ICD-10-CM | POA: Diagnosis not present

## 2015-10-25 DIAGNOSIS — Z79899 Other long term (current) drug therapy: Secondary | ICD-10-CM | POA: Diagnosis not present

## 2015-10-25 DIAGNOSIS — C021 Malignant neoplasm of border of tongue: Secondary | ICD-10-CM | POA: Diagnosis not present

## 2015-10-25 DIAGNOSIS — I10 Essential (primary) hypertension: Secondary | ICD-10-CM | POA: Diagnosis not present

## 2015-10-25 HISTORY — PX: EXCISION OF TONGUE LESION: SHX6434

## 2015-10-25 HISTORY — DX: Malignant neoplasm of tongue, unspecified: C02.9

## 2015-10-25 SURGERY — EXCISION, LESION, TONGUE
Anesthesia: General | Site: Mouth

## 2015-10-25 MED ORDER — LIDOCAINE HCL (CARDIAC) 20 MG/ML IV SOLN
INTRAVENOUS | Status: DC | PRN
  Administered 2015-10-25: 1000 mg via INTRAVENOUS

## 2015-10-25 MED ORDER — SCOPOLAMINE 1 MG/3DAYS TD PT72: 1.0000 | MEDICATED_PATCH | Freq: Once | TRANSDERMAL | Status: DC | PRN

## 2015-10-25 MED ORDER — OXYCODONE HCL 5 MG PO TABS
ORAL_TABLET | ORAL | Status: AC
  Filled 2015-10-25: qty 1

## 2015-10-25 MED ORDER — BUPIVACAINE HCL (PF) 0.25 % IJ SOLN
INTRAMUSCULAR | Status: AC
  Filled 2015-10-25: qty 30

## 2015-10-25 MED ORDER — ONDANSETRON HCL 4 MG/2ML IJ SOLN
INTRAMUSCULAR | Status: AC
  Filled 2015-10-25: qty 2

## 2015-10-25 MED ORDER — MIDAZOLAM HCL 2 MG/2ML IJ SOLN
INTRAMUSCULAR | Status: AC
  Filled 2015-10-25: qty 2

## 2015-10-25 MED ORDER — SUCCINYLCHOLINE CHLORIDE 20 MG/ML IJ SOLN
INTRAMUSCULAR | Status: DC | PRN
  Administered 2015-10-25: 140 mg via INTRAVENOUS

## 2015-10-25 MED ORDER — FENTANYL CITRATE (PF) 100 MCG/2ML IJ SOLN
25.0000 ug | INTRAMUSCULAR | Status: DC | PRN
  Administered 2015-10-25: 25 ug via INTRAVENOUS
  Administered 2015-10-25: 50 ug via INTRAVENOUS
  Administered 2015-10-25: 25 ug via INTRAVENOUS

## 2015-10-25 MED ORDER — MIDAZOLAM HCL 2 MG/2ML IJ SOLN
1.0000 mg | INTRAMUSCULAR | Status: DC | PRN
  Administered 2015-10-25: 2 mg via INTRAVENOUS

## 2015-10-25 MED ORDER — ONDANSETRON HCL 4 MG/2ML IJ SOLN: 4.0000 mg | Freq: Once | INTRAMUSCULAR | Status: DC | PRN

## 2015-10-25 MED ORDER — OXYMETAZOLINE HCL 0.05 % NA SOLN
NASAL | Status: AC
  Filled 2015-10-25: qty 15

## 2015-10-25 MED ORDER — GLYCOPYRROLATE 0.2 MG/ML IJ SOLN: 0.2000 mg | Freq: Once | INTRAMUSCULAR | Status: DC | PRN

## 2015-10-25 MED ORDER — AMOXICILLIN 400 MG/5ML PO SUSR: 800.0000 mg | Freq: Two times a day (BID) | ORAL | Status: AC

## 2015-10-25 MED ORDER — PROPOFOL 10 MG/ML IV BOLUS
INTRAVENOUS | Status: DC | PRN
  Administered 2015-10-25: 250 mg via INTRAVENOUS

## 2015-10-25 MED ORDER — LIDOCAINE-EPINEPHRINE 1 %-1:100000 IJ SOLN
INTRAMUSCULAR | Status: AC
  Filled 2015-10-25: qty 1

## 2015-10-25 MED ORDER — FENTANYL CITRATE (PF) 100 MCG/2ML IJ SOLN
INTRAMUSCULAR | Status: AC
  Filled 2015-10-25: qty 2

## 2015-10-25 MED ORDER — BACITRACIN ZINC 500 UNIT/GM EX OINT
TOPICAL_OINTMENT | CUTANEOUS | Status: AC
  Filled 2015-10-25: qty 4.5

## 2015-10-25 MED ORDER — OXYCODONE HCL 5 MG/5ML PO SOLN: 5.0000 mg | Freq: Once | ORAL | Status: AC | PRN

## 2015-10-25 MED ORDER — LIDOCAINE 2% (20 MG/ML) 5 ML SYRINGE
INTRAMUSCULAR | Status: AC
  Filled 2015-10-25: qty 5

## 2015-10-25 MED ORDER — DEXAMETHASONE SODIUM PHOSPHATE 4 MG/ML IJ SOLN
INTRAMUSCULAR | Status: DC | PRN
  Administered 2015-10-25: 10 mg via INTRAVENOUS

## 2015-10-25 MED ORDER — OXYCODONE-ACETAMINOPHEN 5-325 MG PO TABS: 1.0000 | ORAL_TABLET | ORAL | Status: DC | PRN

## 2015-10-25 MED ORDER — BUPIVACAINE HCL 0.25 % IJ SOLN
INTRAMUSCULAR | Status: DC | PRN
  Administered 2015-10-25: 6.5 mL

## 2015-10-25 MED ORDER — OXYCODONE HCL 5 MG PO TABS
5.0000 mg | ORAL_TABLET | Freq: Once | ORAL | Status: AC | PRN
  Administered 2015-10-25: 5 mg via ORAL

## 2015-10-25 MED ORDER — MUPIROCIN 2 % EX OINT
TOPICAL_OINTMENT | CUTANEOUS | Status: AC
  Filled 2015-10-25: qty 22

## 2015-10-25 MED ORDER — LACTATED RINGERS IV SOLN
INTRAVENOUS | Status: DC
  Administered 2015-10-25 (×2): via INTRAVENOUS

## 2015-10-25 MED ORDER — FENTANYL CITRATE (PF) 100 MCG/2ML IJ SOLN
50.0000 ug | INTRAMUSCULAR | Status: DC | PRN
  Administered 2015-10-25: 100 ug via INTRAVENOUS
  Administered 2015-10-25: 50 ug via INTRAVENOUS

## 2015-10-25 MED ORDER — DEXAMETHASONE SODIUM PHOSPHATE 10 MG/ML IJ SOLN
INTRAMUSCULAR | Status: AC
  Filled 2015-10-25: qty 1

## 2015-10-25 SURGICAL SUPPLY — 30 items
BLADE SURG 15 STRL LF DISP TIS (BLADE) ×1 IMPLANT
BLADE SURG 15 STRL SS (BLADE) ×1
CANISTER SUCT 1200ML W/VALVE (MISCELLANEOUS) ×2 IMPLANT
COVER MAYO STAND STRL (DRAPES) ×2 IMPLANT
DECANTER SPIKE VIAL GLASS SM (MISCELLANEOUS) IMPLANT
DEPRESSOR TONGUE BLADE STERILE (MISCELLANEOUS) IMPLANT
ELECT COATED BLADE 2.86 ST (ELECTRODE) ×2 IMPLANT
ELECT NEEDLE BLADE 2-5/6 (NEEDLE) IMPLANT
ELECT REM PT RETURN 9FT ADLT (ELECTROSURGICAL) ×2
ELECT REM PT RETURN 9FT PED (ELECTROSURGICAL)
ELECTRODE REM PT RETRN 9FT PED (ELECTROSURGICAL) IMPLANT
ELECTRODE REM PT RTRN 9FT ADLT (ELECTROSURGICAL) ×1 IMPLANT
GLOVE BIO SURGEON STRL SZ7.5 (GLOVE) ×2 IMPLANT
GOWN STRL REUS W/ TWL LRG LVL3 (GOWN DISPOSABLE) ×1 IMPLANT
GOWN STRL REUS W/TWL LRG LVL3 (GOWN DISPOSABLE) ×1
NEEDLE PRECISIONGLIDE 27X1.5 (NEEDLE) ×2 IMPLANT
PACK BASIN DAY SURGERY FS (CUSTOM PROCEDURE TRAY) ×2 IMPLANT
PENCIL BUTTON HOLSTER BLD 10FT (ELECTRODE) ×2 IMPLANT
SHEET MEDIUM DRAPE 40X70 STRL (DRAPES) ×2 IMPLANT
SUCTION FRAZIER HANDLE 10FR (MISCELLANEOUS)
SUCTION TUBE FRAZIER 10FR DISP (MISCELLANEOUS) IMPLANT
SUT CHROMIC 5 0 RB 1 27 (SUTURE) IMPLANT
SUT SILK 3 0 TIES 17X18 (SUTURE)
SUT SILK 3-0 18XBRD TIE BLK (SUTURE) IMPLANT
SUT VIC AB 4-0 RB1 27 (SUTURE)
SUT VIC AB 4-0 RB1 27X BRD (SUTURE) IMPLANT
SYR CONTROL 10ML LL (SYRINGE) ×2 IMPLANT
TOWEL OR 17X24 6PK STRL BLUE (TOWEL DISPOSABLE) ×2 IMPLANT
TUBE CONNECTING 20X1/4 (TUBING) ×2 IMPLANT
YANKAUER SUCT BULB TIP NO VENT (SUCTIONS) IMPLANT

## 2015-10-25 NOTE — Discharge Instructions (Addendum)
The patient should advance his diet as tolerated. He may also increase his activity level as tolerated. No driving while taking the narcotic pain medication. The patient will follow up in my office in one week.    Post Anesthesia Home Care Instructions  Activity: Get plenty of rest for the remainder of the day. A responsible adult should stay with you for 24 hours following the procedure.  For the next 24 hours, DO NOT: -Drive a car -Paediatric nurse -Drink alcoholic beverages -Take any medication unless instructed by your physician -Make any legal decisions or sign important papers.  Meals: Start with liquid foods such as gelatin or soup. Progress to regular foods as tolerated. Avoid greasy, spicy, heavy foods. If nausea and/or vomiting occur, drink only clear liquids until the nausea and/or vomiting subsides. Call your physician if vomiting continues.  Special Instructions/Symptoms: Your throat may feel dry or sore from the anesthesia or the breathing tube placed in your throat during surgery. If this causes discomfort, gargle with warm salt water. The discomfort should disappear within 24 hours.  If you had a scopolamine patch placed behind your ear for the management of post- operative nausea and/or vomiting:  1. The medication in the patch is effective for 72 hours, after which it should be removed.  Wrap patch in a tissue and discard in the trash. Wash hands thoroughly with soap and water. 2. You may remove the patch earlier than 72 hours if you experience unpleasant side effects which may include dry mouth, dizziness or visual disturbances. 3. Avoid touching the patch. Wash your hands with soap and water after contact with the patch.   Call your surgeon if you experience:   1.  Fever over 101.0. 2.  Inability to urinate. 3.  Nausea and/or vomiting. 4.  Extreme swelling or bruising at the surgical site. 5.  Continued bleeding from the incision. 6.  Increased pain, redness or  drainage from the incision. 7.  Problems related to your pain medication. 8.  Any problems and/or concerns

## 2015-10-25 NOTE — Transfer of Care (Signed)
Immediate Anesthesia Transfer of Care Note  Patient: Jose Orr  Procedure(s) Performed: Procedure(s): PARTIAL GLOSSECTOMY (N/A)  Patient Location: PACU  Anesthesia Type:General  Level of Consciousness: awake and alert   Airway & Oxygen Therapy: Patient Spontanous Breathing and Patient connected to face mask oxygen  Post-op Assessment: Report given to RN and Post -op Vital signs reviewed and stable  Post vital signs: Reviewed and stable  Last Vitals:  Filed Vitals:   10/25/15 0926 10/25/15 1135  BP: 131/77   Pulse: 71 80  Temp: 36.6 C   Resp: 18     Last Pain:  Filed Vitals:   10/25/15 1135  PainSc: 2          Complications: No apparent anesthesia complications

## 2015-10-25 NOTE — Anesthesia Preprocedure Evaluation (Signed)
Anesthesia Evaluation  Patient identified by MRN, date of birth, ID band Patient awake    Reviewed: Allergy & Precautions, H&P , NPO status , Patient's Chart, lab work & pertinent test results  Airway Mallampati: II  TM Distance: >3 FB Neck ROM: Full    Dental  (+) Teeth Intact, Dental Advisory Given   Pulmonary    breath sounds clear to auscultation       Cardiovascular hypertension, Pt. on medications  Rhythm:Regular Rate:Normal     Neuro/Psych    GI/Hepatic   Endo/Other    Renal/GU      Musculoskeletal   Abdominal   Peds  Hematology   Anesthesia Other Findings   Reproductive/Obstetrics                             Anesthesia Physical  Anesthesia Plan  ASA: III  Anesthesia Plan: General   Post-op Pain Management:    Induction: Intravenous  Airway Management Planned: Oral ETT  Additional Equipment:   Intra-op Plan:   Post-operative Plan:   Informed Consent: I have reviewed the patients History and Physical, chart, labs and discussed the procedure including the risks, benefits and alternatives for the proposed anesthesia with the patient or authorized representative who has indicated his/her understanding and acceptance.   Dental advisory given  Plan Discussed with: CRNA and Anesthesiologist  Anesthesia Plan Comments:         Anesthesia Quick Evaluation

## 2015-10-25 NOTE — H&P (Signed)
Cc: Left tongue SCCA  HPI: The patient is a 60 year old male who returns today for his follow-up evaluation. The patient was last seen 1 week ago.  At that time, he was noted to have a nonhealing ulcer over his left lateral tongue. The ulcer was adjacent to his previous resection site.  The patient underwent biopsy of the nonhealing ulcer. The result was consistent with recurrent squamous cell carcinoma, with an area suspicious for superficial invasion.  The area is tender to touch. The patient denies any significant dysphagia or odynophagia.  No other ENT, GI, or respiratory issue noted since the last visit.   Exam General: Communicates without difficulty, well nourished, no acute distress.   Ears: Auricles well formed without lesions.   Ear canals are intact without mass or lesion.   No erythema or edema is appreciated.   The TMs are intact without fluid.   Nose: External evaluation reveals normal support and skin without lesions.   Dorsum is intact.   Anterior rhinoscopy reveals healthy pink mucosa over anterior aspect of inferior turbinates and intact septum.   No purulence noted.   Oral: The ulcer biopsy site is healing well. The ulcer is adjacent to this previous resection site.   Neck: Full range of motion without pain.   There is no significant lymphadenopathy.   No masses palpable.   Thyroid bed within normal limits to palpation.   Parotid glands and submandibular glands equal bilaterally without mass.  Assessment 1.  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  This is adjacent to his previous resection site.   2. No other lymphadenopathy is noted on today's examination.   Plan  1.  The biopsy results and the physical exam findings are reviewed with the patient.  2.  I would like to obtain an MRI scan to evaluate the extent of the recurrence.   3.  The patient will need to undergo partial glossectomy with intraoperative frozen section analysis. The risks, benefits,  alternatives and details of the procedure are reviewed with the patient.

## 2015-10-25 NOTE — Op Note (Signed)
DATE OF PROCEDURE:  10/25/2015                              OPERATIVE REPORT  SURGEON:  Leta Baptist, MD  PREOPERATIVE DIAGNOSES: 1. Left lateral tongue squamous cell carcinoma  POSTOPERATIVE DIAGNOSES: 1. Left lateral tongue squamous cell carcinoma  PROCEDURE PERFORMED:  Left partial glossectomy  ANESTHESIA:  General endotracheal tube anesthesia.  COMPLICATIONS:  None.  ESTIMATED BLOOD LOSS:  Less than 20 mL  INDICATION FOR PROCEDURE:  Jose Orr is a 60 y.o. male with a history of a nonhealing ulcer on his left lateral tongue. Biopsy of the ulcer was consistent with squamous cell carcinoma in situ, with possible invasive components. The patient previously had a history of left lateral tongue squamous cell carcinoma, status post resection. Based on the above findings, the decision was made for the patient to undergo left partial glossectomy, with intraoperative frozen section analysis of the margins. The risks, benefits, alternatives, and details of the procedure were discussed with the patient.  Questions were invited and answered.  Informed consent was obtained.  DESCRIPTION:  The patient was taken to the operating room and placed supine on the operating table.  General endotracheal tube anesthesia was administered by the anesthesiologist.  The patient was positioned and prepped and draped in a standard fashion for partial glossectomy. A mouth gag was inserted into the oral cavity for exposure. Examination of the oral cavity revealed an ulcer over the left lateral tongue. A 3 x 3 cm soft tissue was resected from the left lateral tongue around the ulcer. Another layer was then resected deep to the soft tissue resection. All the specimens were sent to the pathology department for frozen section analysis. All deep margins were clear of malignancy or atypia. The primary specimen was consistent with squamous cell carcinoma. However the malignancy was centered around the primary specimen, with no  involvement of the edges.   The surgical site was copiously irrigated. 6 mL of Marcaine was then injected into the left lateral tongue. The incision was closed with interrupted 4-0 Vicryl sutures.  The care of the patient was turned over to the anesthesiologist.  The patient was awakened from anesthesia without difficulty.  He was extubated and transferred to the recovery room in good condition.  OPERATIVE FINDINGS: Left lateral tongue squamous cell carcinoma.  SPECIMEN:  Left lateral tongue squamous cell carcinoma specimen. The deep margins were divided into the anterior superior, anterior inferior, posterior superior, and posterior inferior quadrants.  FOLLOWUP CARE:  The patient will be discharged home once awake and alert. He will follow-up in my office in one week.  Ascencion Dike 10/25/2015 11:29 AM

## 2015-10-25 NOTE — Anesthesia Postprocedure Evaluation (Signed)
Anesthesia Post Note  Patient: Jose Orr  Procedure(s) Performed: Procedure(s) (LRB): PARTIAL GLOSSECTOMY (N/A)  Patient location during evaluation: PACU Anesthesia Type: General Level of consciousness: awake and alert and oriented Pain management: pain level controlled Vital Signs Assessment: post-procedure vital signs reviewed and stable Respiratory status: spontaneous breathing, nonlabored ventilation and respiratory function stable Cardiovascular status: blood pressure returned to baseline and stable Postop Assessment: no signs of nausea or vomiting Anesthetic complications: no    Last Vitals:  Filed Vitals:   10/25/15 1215 10/25/15 1224  BP:  153/88  Pulse: 75 72  Temp:    Resp: 16 18    Last Pain:  Filed Vitals:   10/25/15 1225  PainSc: 6                  Katleen Carraway A.

## 2015-10-25 NOTE — Anesthesia Procedure Notes (Signed)
Procedure Name: Intubation Date/Time: 10/25/2015 10:05 AM Performed by: Lieutenant Diego Pre-anesthesia Checklist: Patient identified, Emergency Drugs available, Suction available and Patient being monitored Patient Re-evaluated:Patient Re-evaluated prior to inductionOxygen Delivery Method: Circle System Utilized Preoxygenation: Pre-oxygenation with 100% oxygen Intubation Type: IV induction Ventilation: Mask ventilation without difficulty Laryngoscope Size: Miller and 2 Grade View: Grade I Tube type: Oral Tube size: 8.0 mm Number of attempts: 1 Airway Equipment and Method: Stylet and Oral airway Placement Confirmation: ETT inserted through vocal cords under direct vision,  positive ETCO2 and breath sounds checked- equal and bilateral Secured at: 23 cm Tube secured with: Tape Dental Injury: Teeth and Oropharynx as per pre-operative assessment

## 2015-10-26 ENCOUNTER — Encounter (HOSPITAL_BASED_OUTPATIENT_CLINIC_OR_DEPARTMENT_OTHER): Payer: Self-pay | Admitting: Otolaryngology

## 2016-01-18 DIAGNOSIS — E1165 Type 2 diabetes mellitus with hyperglycemia: Secondary | ICD-10-CM | POA: Diagnosis not present

## 2016-01-18 DIAGNOSIS — E119 Type 2 diabetes mellitus without complications: Secondary | ICD-10-CM | POA: Diagnosis not present

## 2016-01-18 DIAGNOSIS — I10 Essential (primary) hypertension: Secondary | ICD-10-CM | POA: Diagnosis not present

## 2016-01-18 DIAGNOSIS — M109 Gout, unspecified: Secondary | ICD-10-CM | POA: Diagnosis not present

## 2016-01-18 DIAGNOSIS — Z23 Encounter for immunization: Secondary | ICD-10-CM | POA: Diagnosis not present

## 2016-01-18 DIAGNOSIS — E7800 Pure hypercholesterolemia, unspecified: Secondary | ICD-10-CM | POA: Diagnosis not present

## 2016-06-06 DIAGNOSIS — Z85810 Personal history of malignant neoplasm of tongue: Secondary | ICD-10-CM | POA: Diagnosis not present

## 2016-07-19 DIAGNOSIS — Z125 Encounter for screening for malignant neoplasm of prostate: Secondary | ICD-10-CM | POA: Diagnosis not present

## 2016-07-19 DIAGNOSIS — Z23 Encounter for immunization: Secondary | ICD-10-CM | POA: Diagnosis not present

## 2016-07-19 DIAGNOSIS — E7800 Pure hypercholesterolemia, unspecified: Secondary | ICD-10-CM | POA: Diagnosis not present

## 2016-07-19 DIAGNOSIS — M109 Gout, unspecified: Secondary | ICD-10-CM | POA: Diagnosis not present

## 2016-07-19 DIAGNOSIS — E119 Type 2 diabetes mellitus without complications: Secondary | ICD-10-CM | POA: Diagnosis not present

## 2016-07-19 DIAGNOSIS — I10 Essential (primary) hypertension: Secondary | ICD-10-CM | POA: Diagnosis not present

## 2016-07-19 DIAGNOSIS — Z0000 Encounter for general adult medical examination without abnormal findings: Secondary | ICD-10-CM | POA: Diagnosis not present

## 2016-08-03 DIAGNOSIS — H524 Presbyopia: Secondary | ICD-10-CM | POA: Diagnosis not present

## 2016-08-03 DIAGNOSIS — H5213 Myopia, bilateral: Secondary | ICD-10-CM | POA: Diagnosis not present

## 2016-08-03 DIAGNOSIS — H52203 Unspecified astigmatism, bilateral: Secondary | ICD-10-CM | POA: Diagnosis not present

## 2016-08-03 DIAGNOSIS — H2513 Age-related nuclear cataract, bilateral: Secondary | ICD-10-CM | POA: Diagnosis not present

## 2016-08-03 DIAGNOSIS — E119 Type 2 diabetes mellitus without complications: Secondary | ICD-10-CM | POA: Diagnosis not present

## 2016-12-20 DIAGNOSIS — E119 Type 2 diabetes mellitus without complications: Secondary | ICD-10-CM | POA: Diagnosis not present

## 2017-01-09 DIAGNOSIS — Z85810 Personal history of malignant neoplasm of tongue: Secondary | ICD-10-CM | POA: Diagnosis not present

## 2017-02-13 DIAGNOSIS — Z85810 Personal history of malignant neoplasm of tongue: Secondary | ICD-10-CM | POA: Diagnosis not present

## 2017-04-02 DIAGNOSIS — E7800 Pure hypercholesterolemia, unspecified: Secondary | ICD-10-CM | POA: Diagnosis not present

## 2017-04-02 DIAGNOSIS — Z7984 Long term (current) use of oral hypoglycemic drugs: Secondary | ICD-10-CM | POA: Diagnosis not present

## 2017-04-02 DIAGNOSIS — E1165 Type 2 diabetes mellitus with hyperglycemia: Secondary | ICD-10-CM | POA: Diagnosis not present

## 2017-04-02 DIAGNOSIS — M109 Gout, unspecified: Secondary | ICD-10-CM | POA: Diagnosis not present

## 2017-04-02 DIAGNOSIS — I10 Essential (primary) hypertension: Secondary | ICD-10-CM | POA: Diagnosis not present

## 2017-04-02 DIAGNOSIS — Z23 Encounter for immunization: Secondary | ICD-10-CM | POA: Diagnosis not present

## 2017-05-10 ENCOUNTER — Ambulatory Visit (INDEPENDENT_AMBULATORY_CARE_PROVIDER_SITE_OTHER): Payer: BLUE CROSS/BLUE SHIELD

## 2017-05-10 ENCOUNTER — Encounter: Payer: Self-pay | Admitting: Podiatry

## 2017-05-10 ENCOUNTER — Ambulatory Visit: Payer: BLUE CROSS/BLUE SHIELD | Admitting: Podiatry

## 2017-05-10 DIAGNOSIS — M2040 Other hammer toe(s) (acquired), unspecified foot: Secondary | ICD-10-CM

## 2017-05-10 DIAGNOSIS — M779 Enthesopathy, unspecified: Secondary | ICD-10-CM | POA: Diagnosis not present

## 2017-05-10 DIAGNOSIS — M21619 Bunion of unspecified foot: Secondary | ICD-10-CM | POA: Diagnosis not present

## 2017-05-10 MED ORDER — TRIAMCINOLONE ACETONIDE 10 MG/ML IJ SUSP
10.0000 mg | Freq: Once | INTRAMUSCULAR | Status: AC
  Administered 2017-05-10: 10 mg

## 2017-05-10 NOTE — Patient Instructions (Addendum)
Bunion A bunion is a bump on the base of the big toe that forms when the bones of the big toe joint move out of position. Bunions may be small at first, but they often get larger over time. The can make walking painful. What are the causes? A bunion may be caused by:  Wearing narrow or pointed shoes that force the big toe to press against the other toes.  Abnormal foot development that causes the foot to roll inward (pronate).  Changes in the foot that are caused by certain diseases, such as rheumatoid arthritis and polio.  A foot injury.  What increases the risk? The following factors may make you more likely to develop this condition:  Wearing shoes that squeeze the toes together.  Having certain diseases, such as: ? Rheumatoid arthritis. ? Polio. ? Cerebral palsy.  Having family members who have bunions.  Being born with a foot deformity, such as flat feet or low arches.  Doing activities that put a lot of pressure on the feet, such as ballet dancing.  What are the signs or symptoms? The main symptom of a bunion is a noticeable bump on the big toe. Other symptoms may include:  Pain.  Swelling around the big toe.  Redness and inflammation.  Thick or hardened skin on the big toe or between the toes.  Stiffness or loss of motion in the big toe.  Trouble with walking.  How is this diagnosed? A bunion may be diagnosed based on your symptoms, medical history, and activities. You may have tests, such as:  X-rays. These allow your health care provider to check the position of the bones in your foot and look for damage to your joint. They also help your health care provider to determine the severity of your bunion and the best way to treat it.  Joint aspiration. In this test, a sample of fluid is removed from the toe joint. This test, which may be done if you are in a lot of pain, helps to rule out diseases that cause painful swelling of the joints, such as  arthritis.  How is this treated? There is no cure for a bunion, but treatment can help to prevent a bunion from getting worse. Treatment depends on the severity of your symptoms. Your health care provider may recommend:  Wearing shoes that have a wide toe box.  Using bunion pads to cushion the affected area.  Taping your toes together to keep them in a normal position.  Placing a device inside your shoe (orthotics) to help reduce pressure on your toe joint.  Taking medicine to ease pain, inflammation, and swelling.  Applying heat or ice to the affected area.  Doing stretching exercises.  Surgery to remove scar tissue and move the toes back into their normal position. This treatment is rare.  Follow these instructions at home:  Support your toe joint with proper footwear, shoe padding, or taping as told by your health care provider.  Take over-the-counter and prescription medicines only as told by your health care provider.  If directed, apply ice to the injured area: ? Put ice in a plastic bag. ? Place a towel between your skin and the bag. ? Leave the ice on for 20 minutes, 2-3 times per day.  If directed, apply heat to the affected area before you exercise. Use the heat source that your health care provider recommends, such as a moist heat pack or a heating pad. ? Place a towel between your   skin and the heat source. ? Leave the heat on for 20-30 minutes. ? Remove the heat if your skin turns bright red. This is especially important if you are unable to feel pain, heat, or cold. You may have a greater risk of getting burned.  Do exercises as told by your health care provider.  Keep all follow-up visits as told by your health care provider. Contact a health care provider if:  Your symptoms get worse.  Your symptoms do not improve in 2 weeks. Get help right away if:  You have severe pain and trouble with walking. This information is not intended to replace advice given  to you by your health care provider. Make sure you discuss any questions you have with your health care provider. Document Released: 06/11/2005 Document Revised: 11/17/2015 Document Reviewed: 01/09/2015 Elsevier Interactive Patient Education  2018 [employer]  Pre-Operative Instructions  Congratulations, you have decided to take an important step towards improving your quality of life.  You can be assured that the doctors and staff at [facility] will be with you every step of the way.  Here are some important things you should know:  1. Plan to be at the surgery center/hospital at least 1 (one) hour prior to your scheduled time, unless otherwise directed by the surgical center/hospital staff.  You must have a responsible adult accompany you, remain during the surgery and drive you home.  Make sure you have directions to the surgical center/hospital to ensure you arrive on time. 2. If you are having surgery at [facility] or [facility], you will need a copy of your medical history and physical form from your family physician within one month prior to the date of surgery. We will give you a form for your primary physician to complete.  3. We make every effort to accommodate the date you request for surgery.  However, there are times where surgery dates or times have to be moved.  We will contact you as soon as possible if a change in schedule is required.   4. No aspirin/ibuprofen for one week before surgery.  If you are on aspirin, any non-steroidal anti-inflammatory medications (Mobic, Aleve, Ibuprofen) should not be taken seven (7) days prior to your surgery.  You make take Tylenol for pain prior to surgery.  5. Medications - If you are taking daily heart and blood pressure medications, seizure, reflux, allergy, asthma, anxiety, pain or diabetes medications, make sure you notify the surgery center/hospital before the day of surgery so they can tell you which medications you should  take or avoid the day of surgery. 6. No food or drink after midnight the night before surgery unless directed otherwise by surgical center/hospital staff. 7. No alcoholic beverages 24-hours prior to surgery.  No smoking 24-hours prior or 24-hours after surgery. 8. Wear loose pants or shorts. They should be loose enough to fit over bandages, boots, and casts. 9. Don't wear slip-on shoes. Sneakers are preferred. 10. Bring your boot with you to the surgery center/hospital.  Also bring crutches or a walker if your physician has prescribed it for you.  If you do not have this equipment, it will be provided for you after surgery. 11. If you have not been contacted by the surgery center/hospital by the day before your surgery, call to confirm the date and time of your surgery. 12. Leave-time from work may vary depending on the type of surgery you have.  Appropriate arrangements should be made prior   to surgery with your employer. 13. Prescriptions will be provided immediately following surgery by your doctor.  Fill these as soon as possible after surgery and take the medication as directed. Pain medications will not be refilled on weekends and must be approved by the doctor. 14. Remove nail polish on the operative foot and avoid getting pedicures prior to surgery. 15. Wash the night before surgery.  The night before surgery wash the foot and leg well with water and the antibacterial soap provided. Be sure to pay special attention to beneath the toenails and in between the toes.  Wash for at least three (3) minutes. Rinse thoroughly with water and dry well with a towel.  Perform this wash unless told not to do so by your physician.  Enclosed: 1 Ice pack (please put in freezer the night before surgery)   1 Hibiclens skin cleaner   Pre-op instructions  If you have any questions regarding the instructions, please do not hesitate to call our office.  [place]: [address], [place], [place] [address] --  [phone]  [place]: [address] [place], [place] [address] -- [phone]  [place]: [address]  [place], [place] [address] -- [phone]  [place]: [address], [address], [place], [place] [address] -- [phone]  Website: [email or web address]  

## 2017-05-14 NOTE — Progress Notes (Signed)
Subjective:    Patient ID: Jose Orr, male   DOB: 61 y.o.   MRN: [id number]   HPI patient states she's having problems with both feet but the left foot has been chronic for a number of years and he knowingly needs to get it fixed. Patient states she's tried wider shoes and other modalities without relief and it's gradually becoming more intense    Review of Systems  All other systems reviewed and are negative.       Objective:  Physical Exam  Constitutional: He appears well-developed and well-nourished.  Cardiovascular: Intact distal pulses.  Pulmonary/Chest: Effort normal.  Musculoskeletal: Normal range of motion.  Neurological: He is alert.  Skin: Skin is warm.  Nursing note and vitals reviewed.  neurovascular status intact muscle strength adequate range of motion within normal limits with patient noted to have a elevated second and third digit left with rigid contracture at the MPJ pain in the second MPJ and also structural bunion deformity left which is gradually becoming more bothersome over the last couple years. Deformity noted right but not to the same degree with inflammation and pain upon palpation. Found have good digital perfusion and is well oriented     Assessment:    Chronic structural changes the left over right foot with chronic inflammation of the second MPJ left structural bunion deformity digital deformity noted with pain along with deformity of the right which is in a earlier stage     Plan:    H&P conditions reviewed and discussed. At this point I recommended surgical intervention I've recommended for the left foot due to long-standing nature and uncomfortableness and inability to wear shoe gear without difficulty a digital fusion digits 23 along with shortening osteotomy and first metatarsal osteotomy. Patient wants surgery is motivated to get it done like to do it soon and at this point I allowed him to read consent form reviewing alternative treatments  complications. Patient wants surgery signed consent form is given all instructions for surgery and what would be required along with complications and the fact recovery take 6 months to one year and there is no guarantee the digital purchase postoperatively. Patient is willing to accept risk of surgery signed consent form and is also dispensed air fracture walker at the current time for the postoperative period with instructions on usage  X-rays indicate that there is structural deformity with digital deformities of the lesser M lesser digits and structural bunion deformity

## 2017-05-28 ENCOUNTER — Encounter: Payer: Self-pay | Admitting: Podiatry

## 2017-05-28 DIAGNOSIS — M2012 Hallux valgus (acquired), left foot: Secondary | ICD-10-CM | POA: Diagnosis not present

## 2017-05-28 DIAGNOSIS — E7800 Pure hypercholesterolemia, unspecified: Secondary | ICD-10-CM | POA: Diagnosis not present

## 2017-05-28 DIAGNOSIS — M216X2 Other acquired deformities of left foot: Secondary | ICD-10-CM | POA: Diagnosis not present

## 2017-05-28 DIAGNOSIS — M2040 Other hammer toe(s) (acquired), unspecified foot: Secondary | ICD-10-CM | POA: Diagnosis not present

## 2017-05-28 DIAGNOSIS — M2042 Other hammer toe(s) (acquired), left foot: Secondary | ICD-10-CM | POA: Diagnosis not present

## 2017-05-28 DIAGNOSIS — M21542 Acquired clubfoot, left foot: Secondary | ICD-10-CM

## 2017-05-28 DIAGNOSIS — M21612 Bunion of left foot: Secondary | ICD-10-CM | POA: Diagnosis not present

## 2017-06-05 ENCOUNTER — Encounter: Payer: Self-pay | Admitting: Podiatry

## 2017-06-05 ENCOUNTER — Ambulatory Visit (INDEPENDENT_AMBULATORY_CARE_PROVIDER_SITE_OTHER): Payer: BLUE CROSS/BLUE SHIELD | Admitting: Podiatry

## 2017-06-05 ENCOUNTER — Ambulatory Visit (INDEPENDENT_AMBULATORY_CARE_PROVIDER_SITE_OTHER): Payer: BLUE CROSS/BLUE SHIELD

## 2017-06-05 VITALS — Temp 97.5°F

## 2017-06-05 DIAGNOSIS — M21619 Bunion of unspecified foot: Secondary | ICD-10-CM

## 2017-06-05 DIAGNOSIS — M2040 Other hammer toe(s) (acquired), unspecified foot: Secondary | ICD-10-CM

## 2017-06-05 MED ORDER — OXYCODONE-ACETAMINOPHEN 10-325 MG PO TABS: 1.0000 | ORAL_TABLET | Freq: Three times a day (TID) | ORAL | 0 refills | Status: DC | PRN

## 2017-06-06 NOTE — Progress Notes (Signed)
Subjective:   Patient ID: Jose Orr, male   DOB: 61 y.o.   MRN: [id number]   HPI Patient presents stating I am doing well and nonweightbearing on the foot but I am still using a crutch.   ROS      Objective:  Physical Exam  Neurovascular status intact negative Homans sign was noted with patient's right foot healing well with wound edges well coapted and pins in place second and third toe with good alignment noted.     Assessment:  Patient is doing well post reconstruction.       Plan:  H&P and x-rays reviewed with patient.  Today I have recommended continued elevation compression and immobilization explaining how to do this with patient to stay immobilized currently patient will be seen back in 2 weeks or earlier if any issues should occur.  X-rays indicate osteotomy is healing well with good alignment pins in place in good structural position noted.

## 2017-06-19 ENCOUNTER — Ambulatory Visit (INDEPENDENT_AMBULATORY_CARE_PROVIDER_SITE_OTHER): Payer: BLUE CROSS/BLUE SHIELD | Admitting: Podiatry

## 2017-06-19 ENCOUNTER — Encounter: Payer: Self-pay | Admitting: Podiatry

## 2017-06-19 DIAGNOSIS — M21619 Bunion of unspecified foot: Secondary | ICD-10-CM

## 2017-06-19 DIAGNOSIS — Z9889 Other specified postprocedural states: Secondary | ICD-10-CM

## 2017-06-19 DIAGNOSIS — M2040 Other hammer toe(s) (acquired), unspecified foot: Secondary | ICD-10-CM | POA: Diagnosis not present

## 2017-06-26 ENCOUNTER — Ambulatory Visit (INDEPENDENT_AMBULATORY_CARE_PROVIDER_SITE_OTHER): Payer: BLUE CROSS/BLUE SHIELD

## 2017-06-26 ENCOUNTER — Ambulatory Visit (INDEPENDENT_AMBULATORY_CARE_PROVIDER_SITE_OTHER): Payer: BLUE CROSS/BLUE SHIELD | Admitting: Podiatry

## 2017-06-26 ENCOUNTER — Encounter: Payer: Self-pay | Admitting: Podiatry

## 2017-06-26 DIAGNOSIS — M2040 Other hammer toe(s) (acquired), unspecified foot: Secondary | ICD-10-CM | POA: Diagnosis not present

## 2017-06-26 DIAGNOSIS — M21619 Bunion of unspecified foot: Secondary | ICD-10-CM

## 2017-06-26 NOTE — Progress Notes (Signed)
Subjective:   Patient ID: Jose Orr, male   DOB: 62 y.o.   MRN: [id number]   HPI Patient presents stating doing really well with surgery and states he is having minimal discomfort and is back to work   ROS      Objective:  Physical Exam  Neurovascular status intact negative Homans sign noted with pins in place second and third toes left first metatarsal in good alignment with excellent range of motion     Assessment:  Patient is doing well post osteotomy digital fusion digits 2 3 left     Plan:  Pins removed second and third digits sterile dressing applied x-ray reviewed and patient made gradual return to soft shoe gear and reappoint 4 weeks or earlier if needed  X-rays indicate excellent alignment with good digital correction and good osteotomy correction

## 2017-06-26 NOTE — Progress Notes (Signed)
   Subjective:  Patient presents today status post left forefoot surgery. DOS: 05/28/17.  He states he is doing well overall.  He states the pain is tolerable.  He reports a burning sensation to the distal tips of the toes.  He rates the pain at 3/10 currently.  Patient is here for further evaluation and treatment.   Past Medical History:  Diagnosis Date  . Gout    last episode 2010, taking Allopurinol  . Hyperlipidemia   . Hypertension   . Tongue cancer ([place])   . Wears glasses       Objective/Physical Exam Skin incisions appear to be well coapted with sutures and staples intact. No sign of infectious process noted. No dehiscence. No active bleeding noted. Moderate edema noted to the surgical extremity.  Assessment: 1. s/p left forefoot surgery. DOS: 05/28/17.   Plan of Care:  1.  Patient was evaluated. 2.  Sutures removed. 3.  Continue weightbearing in postop shoe. 4.  Return to clinic in 1 week for percutaneous pin removal with Dr. Paulla Dolly.   Edrick Kins, DPM [facility]  Dr. Edrick Kins, [address]                                        [place], [place] [address]                Office [phone]  Fax [phone]

## 2017-07-26 NOTE — Progress Notes (Signed)
DOS 05/28/17 [place] Bunionectomy w fixation Lt, shortening osteotomy, fusion w pin 2, 3 Lt w screw 2 Lt

## 2017-07-31 ENCOUNTER — Ambulatory Visit (INDEPENDENT_AMBULATORY_CARE_PROVIDER_SITE_OTHER): Payer: BLUE CROSS/BLUE SHIELD | Admitting: Podiatry

## 2017-07-31 ENCOUNTER — Encounter: Payer: Self-pay | Admitting: Podiatry

## 2017-07-31 ENCOUNTER — Ambulatory Visit (INDEPENDENT_AMBULATORY_CARE_PROVIDER_SITE_OTHER): Payer: BLUE CROSS/BLUE SHIELD

## 2017-07-31 DIAGNOSIS — M2012 Hallux valgus (acquired), left foot: Secondary | ICD-10-CM

## 2017-07-31 DIAGNOSIS — S93402D Sprain of unspecified ligament of left ankle, subsequent encounter: Secondary | ICD-10-CM

## 2017-07-31 NOTE — Progress Notes (Signed)
Subjective:   Patient ID: Jose Orr, male   DOB: 62 y.o.   MRN: [id number]   HPI Patient presents stating he is concerned because he twisted his ankle left and is worried that he may have damaged it.  States it is making it hard for him to walk at times and states overall he is doing very well with his foot but at times it still bothers him quite a bit   ROS      Objective:  Physical Exam  Neurovascular status intact with patient found to have inflammation and pain of the left lateral ankle with mild inflammation of the forefoot secondary to extensive surgery that was performed     Assessment:  Doing well post surgery with patient noted to have edema consistent with a trauma to the left ankle F to H&P x-rays reviewed and at this time I went ahead and I recommended compression elevation and gradual increase in activity levels.  Patient will be seen back for [place] to recheck again in the next 4-6 weeks     Plan:  X-rays indicate that everything is healing well with no indications of significant pathology with fixation in place and no indication of ankle fracture

## 2017-08-06 DIAGNOSIS — E119 Type 2 diabetes mellitus without complications: Secondary | ICD-10-CM | POA: Diagnosis not present

## 2017-08-06 DIAGNOSIS — H25013 Cortical age-related cataract, bilateral: Secondary | ICD-10-CM | POA: Diagnosis not present

## 2017-08-06 DIAGNOSIS — H2513 Age-related nuclear cataract, bilateral: Secondary | ICD-10-CM | POA: Diagnosis not present

## 2017-08-14 DIAGNOSIS — Z85810 Personal history of malignant neoplasm of tongue: Secondary | ICD-10-CM | POA: Diagnosis not present

## 2017-08-15 DIAGNOSIS — E119 Type 2 diabetes mellitus without complications: Secondary | ICD-10-CM | POA: Diagnosis not present

## 2017-08-15 DIAGNOSIS — I10 Essential (primary) hypertension: Secondary | ICD-10-CM | POA: Diagnosis not present

## 2017-08-15 DIAGNOSIS — E7800 Pure hypercholesterolemia, unspecified: Secondary | ICD-10-CM | POA: Diagnosis not present

## 2017-08-15 DIAGNOSIS — M109 Gout, unspecified: Secondary | ICD-10-CM | POA: Diagnosis not present

## 2017-08-15 DIAGNOSIS — Z0000 Encounter for general adult medical examination without abnormal findings: Secondary | ICD-10-CM | POA: Diagnosis not present

## 2017-09-11 ENCOUNTER — Encounter: Payer: Self-pay | Admitting: Podiatry

## 2017-09-11 ENCOUNTER — Ambulatory Visit (INDEPENDENT_AMBULATORY_CARE_PROVIDER_SITE_OTHER): Payer: BLUE CROSS/BLUE SHIELD

## 2017-09-11 ENCOUNTER — Ambulatory Visit (INDEPENDENT_AMBULATORY_CARE_PROVIDER_SITE_OTHER): Payer: BLUE CROSS/BLUE SHIELD | Admitting: Podiatry

## 2017-09-11 DIAGNOSIS — M2012 Hallux valgus (acquired), left foot: Secondary | ICD-10-CM | POA: Diagnosis not present

## 2017-09-11 DIAGNOSIS — Z472 Encounter for removal of internal fixation device: Secondary | ICD-10-CM | POA: Diagnosis not present

## 2017-09-11 NOTE — Progress Notes (Signed)
Subjective:   Patient ID: Jose Orr, male   DOB: 62 y.o.   MRN: [id number]   HPI Patient presents stating in the last couple weeks he started to develop a lot of pain on the top of the left first metatarsal and it is hard for him to wear shoe gear comfortably   ROS      Objective:  Physical Exam  Neurovascular status intact with patient found to have discomfort on the dorsum of the left first metatarsal with irritation of the tissue in the proximity of the pins that were placed in for fixation several months ago     Assessment:  Probability for irritation from hands dorsum left first metatarsal     Plan:  H&P conditions reviewed and I recommended due to the pain removal of the pins.  I explained the procedures and risk and patient wants surgery and at this time I allowed him to sign consent form going over the surgery and complications.  Patient wants the procedures understanding risk and is scheduled for office surgery to have these removed in the next several weeks and is encouraged to call with questions and padding applied around the area today  X-rays indicate that most likely the inflammation and pain on top of the left first metatarsal shaft is related to the pin position

## 2017-09-18 ENCOUNTER — Ambulatory Visit (INDEPENDENT_AMBULATORY_CARE_PROVIDER_SITE_OTHER): Payer: BLUE CROSS/BLUE SHIELD | Admitting: Podiatry

## 2017-09-18 ENCOUNTER — Encounter: Payer: Self-pay | Admitting: Podiatry

## 2017-09-18 VITALS — BP 154/86 | HR 66 | Temp 95.5°F | Resp 16

## 2017-09-18 DIAGNOSIS — Z472 Encounter for removal of internal fixation device: Secondary | ICD-10-CM | POA: Diagnosis not present

## 2017-09-18 NOTE — Progress Notes (Signed)
Subjective:   Patient ID: Jose Orr, male   DOB: 62 y.o.   MRN: [id number]   HPI Patient presents stating that these pins have been starting to really bother him and make shoe gear difficult.  States is been becoming more bothersome over the last couple months   ROS      Objective:  Physical Exam  Neurovascular status intact with patient noted to have prominence of pin position the left first metatarsal with possibility for reactivity to the metal     Assessment:  Abnormal pin position with possible metal allergy H&P and discussed condition.  At this point I have recommended removal of fixation and I explained procedure and risk and patient wants procedure performed.     Plan:  I injected with 60 mg like Marcaine mixture took patient to the OR did sterile prep of the left foot and inflated tourniquet to 250 mmHg.  Following procedures were performed.  Procedure #1 removal of a more proximal pin left first metatarsal.  2 cm incision was made into the first metatarsal and was taken down to the capsule with hemostasis being acquired as necessary.  It was then opened up with a small incision and the proximal pin was identified.  Utilizing blunt and sharp technique the pin was removed in toto and flushed.  I then went ahead and sutured with 5-0 nylon and the following procedure will be performed.  Procedure #2 removal of second pin left attention directed to the dorsal aspect left first metatarsal where the incision was extended in a distal direction.  The incision was deepened through subcutaneous tissue down to capsule and a linear capsular incision was performed.  Capsular tissue sharply dissected off the underlying bone and the distal pin was identified removed in toto with wound flushed Kanamycin Solution.  Sutured with 4-0 Nylon.  Sterile Dressing Applied to the Left Foot Tourniquet Released Capillary Fill Immediate to All Toes and Patient Left the OR in Satisfactory Condition

## 2017-10-02 ENCOUNTER — Ambulatory Visit (INDEPENDENT_AMBULATORY_CARE_PROVIDER_SITE_OTHER): Payer: BLUE CROSS/BLUE SHIELD

## 2017-10-02 ENCOUNTER — Ambulatory Visit (INDEPENDENT_AMBULATORY_CARE_PROVIDER_SITE_OTHER): Payer: Self-pay | Admitting: Podiatry

## 2017-10-02 VITALS — BP 133/89 | HR 70 | Resp 16

## 2017-10-02 DIAGNOSIS — M2012 Hallux valgus (acquired), left foot: Secondary | ICD-10-CM

## 2017-10-02 DIAGNOSIS — Z9889 Other specified postprocedural states: Secondary | ICD-10-CM

## 2017-10-02 DIAGNOSIS — M2040 Other hammer toe(s) (acquired), unspecified foot: Secondary | ICD-10-CM | POA: Diagnosis not present

## 2017-10-02 DIAGNOSIS — M21619 Bunion of unspecified foot: Secondary | ICD-10-CM

## 2017-10-07 NOTE — Progress Notes (Signed)
   Subjective:  Patient presents today status post bunionectomy and hammertoe repair of the left foot. DOS: 09/18/17. He denies any complaints at this time. He denies pain. Patient is here for further evaluation and treatment.    Past Medical History:  Diagnosis Date  . Gout    last episode 2010, taking Allopurinol  . Hyperlipidemia   . Hypertension   . Tongue cancer ([place])   . Wears glasses       Objective/Physical Exam Neurovascular status intact.  Skin incisions appear to be well coapted. No sign of infectious process noted. No dehiscence. No active bleeding noted. Moderate edema noted to the surgical extremity.  Radiographic Exam:  Orthopedic hardware and osteotomies sites appear to be stable with routine healing.  Assessment: 1. s/p bunionectomy and hammertoe repair left. DOS: 09/18/17   Plan of Care:  1. Patient was evaluated. X-rays reviewed 2. Sutures removed today.  3. Discontinue wearing post op shoe.  4. May resume regular activity.  5. Return to clinic in 4 weeks with Dr. Paulla Dolly.    Edrick Kins, DPM [facility]  Dr. Edrick Kins, [address]                                        [place], [place] [address]                Office [phone]  Fax [phone]

## 2017-10-23 ENCOUNTER — Ambulatory Visit (INDEPENDENT_AMBULATORY_CARE_PROVIDER_SITE_OTHER): Payer: BLUE CROSS/BLUE SHIELD | Admitting: Podiatry

## 2017-10-23 ENCOUNTER — Encounter: Payer: Self-pay | Admitting: Podiatry

## 2017-10-23 DIAGNOSIS — M2040 Other hammer toe(s) (acquired), unspecified foot: Secondary | ICD-10-CM

## 2017-10-23 DIAGNOSIS — Z9889 Other specified postprocedural states: Secondary | ICD-10-CM

## 2017-10-23 DIAGNOSIS — M2012 Hallux valgus (acquired), left foot: Secondary | ICD-10-CM

## 2017-10-23 NOTE — Progress Notes (Signed)
Subjective:   Patient ID: Jose Orr, male   DOB: 62 y.o.   MRN: [id number]   HPI Patient states doing very well with minimal discomfort and swelling   ROS      Objective:  Physical Exam  Neurovascular status intact with wound edges well coapted left with excellent range of motion first MPJ and second and third digits in good alignment     Assessment:  Doing well post osteotomy first metatarsal left and removal of fixation which is taking care of the pain     Plan:  Advised on anti-inflammatories and continued increase in activity levels.  Patient is discharged unless needs to be seen for any

## 2018-02-12 DIAGNOSIS — I10 Essential (primary) hypertension: Secondary | ICD-10-CM | POA: Diagnosis not present

## 2018-02-12 DIAGNOSIS — E7800 Pure hypercholesterolemia, unspecified: Secondary | ICD-10-CM | POA: Diagnosis not present

## 2018-02-12 DIAGNOSIS — M109 Gout, unspecified: Secondary | ICD-10-CM | POA: Diagnosis not present

## 2018-02-12 DIAGNOSIS — E119 Type 2 diabetes mellitus without complications: Secondary | ICD-10-CM | POA: Diagnosis not present

## 2018-02-17 DIAGNOSIS — E7800 Pure hypercholesterolemia, unspecified: Secondary | ICD-10-CM | POA: Diagnosis not present

## 2018-02-17 DIAGNOSIS — E119 Type 2 diabetes mellitus without complications: Secondary | ICD-10-CM | POA: Diagnosis not present

## 2018-02-17 DIAGNOSIS — I10 Essential (primary) hypertension: Secondary | ICD-10-CM | POA: Diagnosis not present

## 2018-02-17 DIAGNOSIS — M109 Gout, unspecified: Secondary | ICD-10-CM | POA: Diagnosis not present

## 2018-02-19 DIAGNOSIS — Z85810 Personal history of malignant neoplasm of tongue: Secondary | ICD-10-CM | POA: Diagnosis not present

## 2018-08-19 DIAGNOSIS — H2513 Age-related nuclear cataract, bilateral: Secondary | ICD-10-CM | POA: Diagnosis not present

## 2018-08-19 DIAGNOSIS — H5213 Myopia, bilateral: Secondary | ICD-10-CM | POA: Diagnosis not present

## 2018-08-19 DIAGNOSIS — E119 Type 2 diabetes mellitus without complications: Secondary | ICD-10-CM | POA: Diagnosis not present

## 2018-09-17 DIAGNOSIS — Z85810 Personal history of malignant neoplasm of tongue: Secondary | ICD-10-CM | POA: Diagnosis not present

## 2018-09-24 DEATH — deceased

## 2020-03-10 ENCOUNTER — Other Ambulatory Visit: Payer: Self-pay | Admitting: Family Medicine

## 2020-03-10 ENCOUNTER — Other Ambulatory Visit (HOSPITAL_COMMUNITY): Payer: Self-pay | Admitting: Family Medicine

## 2020-03-10 DIAGNOSIS — H532 Diplopia: Secondary | ICD-10-CM

## 2020-03-14 ENCOUNTER — Other Ambulatory Visit: Payer: Self-pay

## 2020-03-14 ENCOUNTER — Encounter (INDEPENDENT_AMBULATORY_CARE_PROVIDER_SITE_OTHER): Payer: Self-pay

## 2020-03-14 ENCOUNTER — Ambulatory Visit (INDEPENDENT_AMBULATORY_CARE_PROVIDER_SITE_OTHER): Payer: Commercial Managed Care - PPO

## 2020-03-14 DIAGNOSIS — H532 Diplopia: Secondary | ICD-10-CM | POA: Diagnosis not present

## 2020-03-14 MED ORDER — GADOBUTROL 1 MMOL/ML IV SOLN
10.0000 mL | Freq: Once | INTRAVENOUS | Status: AC | PRN
  Administered 2020-03-14: 10 mL via INTRAVENOUS

## 2022-03-08 ENCOUNTER — Ambulatory Visit (INDEPENDENT_AMBULATORY_CARE_PROVIDER_SITE_OTHER): Payer: Medicare Other | Admitting: Podiatry

## 2022-03-08 ENCOUNTER — Encounter: Payer: Self-pay | Admitting: Podiatry

## 2022-03-08 ENCOUNTER — Ambulatory Visit (INDEPENDENT_AMBULATORY_CARE_PROVIDER_SITE_OTHER): Payer: Medicare Other

## 2022-03-08 DIAGNOSIS — M2042 Other hammer toe(s) (acquired), left foot: Secondary | ICD-10-CM

## 2022-03-08 DIAGNOSIS — L600 Ingrowing nail: Secondary | ICD-10-CM | POA: Diagnosis not present

## 2022-03-08 NOTE — Progress Notes (Signed)
Subjective:   Patient ID: Jose Orr, male   DOB: 66 y.o.   MRN: [id number]   HPI Patient presents with digital deformity digit to left with concerns about the position of the toe and also a damaged thickened nailbeds second left that is been relatively painful when pressed.  Patient states there is been trauma the nail has been sore and hard to cut and he would like it removed.  Patient does not smoke likes to be active   Review of Systems  All other systems reviewed and are negative.       Objective:  Physical Exam Vitals and nursing note reviewed.  Constitutional:      Appearance: He is well-developed.  Pulmonary:     Effort: Pulmonary effort is normal.  Musculoskeletal:        General: Normal range of motion.  Skin:    General: Skin is warm.  Neurological:     Mental Status: He is alert.     Neurovascular status intact muscle strength found to be adequate range of motion adequate with a thickened deformed second nail left foot and also digital deformity of the second digit with distal keratotic lesion history of fusion of the proximal phalanx and metatarsal osteotomy.  Good digital perfusion well-oriented x3     Assessment:  Damaged second nail left chronic along with hammertoe deformity and structural distal deviation of the digit     Plan:  H&P condition discussed and reviewed and for the hammertoe do not recommend current treatment except for good type of shoe gear well fitted and for the nail and recommended removal and explained this to the patient he wants this done and I allowed him to read then signed consent form understanding risk.  I went ahead today and I infiltrated the left second digit 60 mg like Marcaine mixture sterile prep done and using sterile instrumentation remove the nail exposed matrix applied phenol for applications 30 seconds followed by alcohol lavage sterile dressing gave instructions on soaks and to leave dressing on 24 hours but take it off  earlier if throbbing were to occur.  Encouraged to call questions concerns  X-rays indicate the second digit has been satisfactory views there is some distal plantarflexion of the digit at the distal to phalangeal joint screw in place second metatarsal

## 2022-03-08 NOTE — Patient Instructions (Signed)

## 2022-09-16 ENCOUNTER — Encounter (HOSPITAL_BASED_OUTPATIENT_CLINIC_OR_DEPARTMENT_OTHER): Payer: Self-pay | Admitting: Emergency Medicine

## 2022-09-16 ENCOUNTER — Emergency Department (HOSPITAL_BASED_OUTPATIENT_CLINIC_OR_DEPARTMENT_OTHER): Payer: Medicare Other | Admitting: Radiology

## 2022-09-16 ENCOUNTER — Other Ambulatory Visit: Payer: Self-pay

## 2022-09-16 ENCOUNTER — Inpatient Hospital Stay (HOSPITAL_BASED_OUTPATIENT_CLINIC_OR_DEPARTMENT_OTHER)
Admission: EM | Admit: 2022-09-16 | Discharge: 2022-09-18 | DRG: 287 | Disposition: A | Discharge status: Home / Self Care | Payer: Medicare Other | Attending: Internal Medicine | Admitting: Internal Medicine

## 2022-09-16 DIAGNOSIS — I214 Non-ST elevation (NSTEMI) myocardial infarction: Secondary | ICD-10-CM | POA: Diagnosis not present

## 2022-09-16 DIAGNOSIS — M109 Gout, unspecified: Secondary | ICD-10-CM | POA: Diagnosis present

## 2022-09-16 DIAGNOSIS — E669 Obesity, unspecified: Secondary | ICD-10-CM | POA: Diagnosis present

## 2022-09-16 DIAGNOSIS — R079 Chest pain, unspecified: Secondary | ICD-10-CM | POA: Diagnosis not present

## 2022-09-16 DIAGNOSIS — Z85810 Personal history of malignant neoplasm of tongue: Secondary | ICD-10-CM

## 2022-09-16 DIAGNOSIS — R7989 Other specified abnormal findings of blood chemistry: Secondary | ICD-10-CM | POA: Diagnosis present

## 2022-09-16 DIAGNOSIS — Z791 Long term (current) use of non-steroidal anti-inflammatories (NSAID): Secondary | ICD-10-CM | POA: Diagnosis not present

## 2022-09-16 DIAGNOSIS — Z79899 Other long term (current) drug therapy: Secondary | ICD-10-CM | POA: Diagnosis not present

## 2022-09-16 DIAGNOSIS — Z823 Family history of stroke: Secondary | ICD-10-CM | POA: Diagnosis not present

## 2022-09-16 DIAGNOSIS — R001 Bradycardia, unspecified: Secondary | ICD-10-CM | POA: Diagnosis present

## 2022-09-16 DIAGNOSIS — Z7984 Long term (current) use of oral hypoglycemic drugs: Secondary | ICD-10-CM

## 2022-09-16 DIAGNOSIS — E785 Hyperlipidemia, unspecified: Secondary | ICD-10-CM | POA: Diagnosis present

## 2022-09-16 DIAGNOSIS — I2585 Chronic coronary microvascular dysfunction: Secondary | ICD-10-CM | POA: Diagnosis present

## 2022-09-16 DIAGNOSIS — Z8249 Family history of ischemic heart disease and other diseases of the circulatory system: Secondary | ICD-10-CM

## 2022-09-16 DIAGNOSIS — G629 Polyneuropathy, unspecified: Secondary | ICD-10-CM

## 2022-09-16 DIAGNOSIS — I10 Essential (primary) hypertension: Secondary | ICD-10-CM | POA: Insufficient documentation

## 2022-09-16 DIAGNOSIS — E1140 Type 2 diabetes mellitus with diabetic neuropathy, unspecified: Secondary | ICD-10-CM | POA: Diagnosis present

## 2022-09-16 DIAGNOSIS — E876 Hypokalemia: Secondary | ICD-10-CM | POA: Diagnosis not present

## 2022-09-16 DIAGNOSIS — Z6835 Body mass index (BMI) 35.0-35.9, adult: Secondary | ICD-10-CM | POA: Diagnosis not present

## 2022-09-16 DIAGNOSIS — R0789 Other chest pain: Secondary | ICD-10-CM | POA: Diagnosis not present

## 2022-09-16 DIAGNOSIS — E119 Type 2 diabetes mellitus without complications: Secondary | ICD-10-CM

## 2022-09-16 HISTORY — DX: Type 2 diabetes mellitus without complications: E11.9

## 2022-09-16 LAB — HIV ANTIBODY (ROUTINE TESTING W REFLEX): HIV Screen 4th Generation wRfx: NONREACTIVE

## 2022-09-16 LAB — BASIC METABOLIC PANEL
Anion gap: 9 (ref 5–15)
BUN: 16 mg/dL (ref 8–23)
CO2: 27 mmol/L (ref 22–32)
Calcium: 9.6 mg/dL (ref 8.9–10.3)
Chloride: 98 mmol/L (ref 98–111)
Creatinine, Ser: 0.83 mg/dL (ref 0.61–1.24)
GFR, Estimated: >60 mL/min (ref >60)
Glucose, Bld: 182 mg/dL — ABNORMAL HIGH (ref 70–99)
Potassium: 3.7 mmol/L (ref 3.5–5.1)
Sodium: 134 mmol/L — ABNORMAL LOW (ref 135–145)

## 2022-09-16 LAB — TROPONIN I (HIGH SENSITIVITY)
Troponin I (High Sensitivity): 88 ng/L — ABNORMAL HIGH (ref <18)
Troponin I (High Sensitivity): 80 ng/L — ABNORMAL HIGH (ref <18)
Troponin I (High Sensitivity): 70 ng/L — ABNORMAL HIGH (ref <18)
Troponin I (High Sensitivity): 99 ng/L — ABNORMAL HIGH (ref <18)
Troponin I (High Sensitivity): 89 ng/L — ABNORMAL HIGH (ref <18)

## 2022-09-16 LAB — D-DIMER, QUANTITATIVE: D-Dimer, Quant: 0.3 ug/mL-FEU (ref 0.00–0.50)

## 2022-09-16 LAB — CBC
HCT: 40.9 % (ref 39.0–52.0)
Hemoglobin: 14 g/dL (ref 13.0–17.0)
MCH: 31.3 pg (ref 26.0–34.0)
MCHC: 34.2 g/dL (ref 30.0–36.0)
MCV: 91.5 fL (ref 80.0–100.0)
Platelets: 192 10*3/uL (ref 150–400)
RBC: 4.47 MIL/uL (ref 4.22–5.81)
RDW: 14 % (ref 11.5–15.5)
WBC: 10.4 10*3/uL (ref 4.0–10.5)
nRBC: 0 % (ref 0.0–0.2)

## 2022-09-16 LAB — GLUCOSE, CAPILLARY: Glucose-Capillary: 156 mg/dL — ABNORMAL HIGH (ref 70–99)

## 2022-09-16 LAB — HEPARIN LEVEL (UNFRACTIONATED): Heparin Unfractionated: 0.23 IU/mL — ABNORMAL LOW (ref 0.30–0.70)

## 2022-09-16 LAB — CK: Total CK: 54 U/L (ref 49–397)

## 2022-09-16 MED ORDER — NITROGLYCERIN 0.4 MG SL SUBL
0.4000 mg | SUBLINGUAL_TABLET | SUBLINGUAL | Status: DC | PRN
  Administered 2022-09-16 (×3): 0.4 mg via SUBLINGUAL
  Filled 2022-09-16 (×2): qty 1

## 2022-09-16 MED ORDER — HEPARIN (PORCINE) 25000 UT/250ML-% IV SOLN
1400.0000 [IU]/h | INTRAVENOUS | Status: DC
  Administered 2022-09-16: 1200 [IU]/h via INTRAVENOUS
  Administered 2022-09-17: 1400 [IU]/h via INTRAVENOUS
  Filled 2022-09-16 (×2): qty 250

## 2022-09-16 MED ORDER — ALLOPURINOL 300 MG PO TABS
300.0000 mg | ORAL_TABLET | Freq: Every day | ORAL | Status: DC
  Administered 2022-09-16 – 2022-09-18 (×3): 300 mg via ORAL
  Filled 2022-09-16 (×3): qty 1

## 2022-09-16 MED ORDER — HEPARIN BOLUS VIA INFUSION
4000.0000 [IU] | Freq: Once | INTRAVENOUS | Status: AC
  Administered 2022-09-16: 4000 [IU] via INTRAVENOUS

## 2022-09-16 MED ORDER — ASPIRIN 81 MG PO CHEW
324.0000 mg | CHEWABLE_TABLET | Freq: Once | ORAL | Status: AC
  Administered 2022-09-16: 243 mg via ORAL
  Filled 2022-09-16: qty 4

## 2022-09-16 MED ORDER — BISACODYL 5 MG PO TBEC: 5.0000 mg | DELAYED_RELEASE_TABLET | Freq: Every day | ORAL | Status: DC | PRN

## 2022-09-16 MED ORDER — OXYCODONE-ACETAMINOPHEN 10-325 MG PO TABS: 1.0000 | ORAL_TABLET | Freq: Three times a day (TID) | ORAL | Status: DC | PRN

## 2022-09-16 MED ORDER — SENNOSIDES-DOCUSATE SODIUM 8.6-50 MG PO TABS: 1.0000 | ORAL_TABLET | Freq: Every evening | ORAL | Status: DC | PRN

## 2022-09-16 MED ORDER — SODIUM CHLORIDE 0.9 % IV BOLUS: 500.0000 mL | Freq: Once | INTRAVENOUS | Status: DC

## 2022-09-16 MED ORDER — LISINOPRIL 5 MG PO TABS
2.5000 mg | ORAL_TABLET | Freq: Every day | ORAL | Status: DC
  Administered 2022-09-17 – 2022-09-18 (×2): 2.5 mg via ORAL
  Filled 2022-09-16 (×2): qty 1

## 2022-09-16 MED ORDER — HEPARIN BOLUS VIA INFUSION
1500.0000 [IU] | Freq: Once | INTRAVENOUS | Status: AC
  Administered 2022-09-16: 1500 [IU] via INTRAVENOUS
  Filled 2022-09-16: qty 1500

## 2022-09-16 MED ORDER — MORPHINE SULFATE (PF) 4 MG/ML IV SOLN
4.0000 mg | INTRAVENOUS | Status: AC | PRN
  Administered 2022-09-16: 4 mg via INTRAVENOUS

## 2022-09-16 MED ORDER — GABAPENTIN 300 MG PO CAPS
300.0000 mg | ORAL_CAPSULE | Freq: Every day | ORAL | Status: DC
  Administered 2022-09-16 – 2022-09-17 (×2): 300 mg via ORAL
  Filled 2022-09-16 (×2): qty 1

## 2022-09-16 MED ORDER — LIDOCAINE 5 % EX PTCH
1.0000 | MEDICATED_PATCH | CUTANEOUS | Status: DC
  Administered 2022-09-16 – 2022-09-17 (×2): 1 via TRANSDERMAL
  Filled 2022-09-16 (×2): qty 1

## 2022-09-16 MED ORDER — LISINOPRIL 5 MG PO TABS
5.0000 mg | ORAL_TABLET | Freq: Every day | ORAL | Status: DC
  Administered 2022-09-16: 5 mg via ORAL
  Filled 2022-09-16 (×2): qty 1

## 2022-09-16 MED ORDER — MORPHINE SULFATE (PF) 4 MG/ML IV SOLN
INTRAVENOUS | Status: AC
  Filled 2022-09-16: qty 1

## 2022-09-16 MED ORDER — ACETAMINOPHEN 325 MG PO TABS: 650.0000 mg | ORAL_TABLET | ORAL | Status: DC | PRN

## 2022-09-16 MED ORDER — HYDRALAZINE HCL 20 MG/ML IJ SOLN: 5.0000 mg | Freq: Four times a day (QID) | INTRAMUSCULAR | Status: DC | PRN

## 2022-09-16 MED ORDER — MORPHINE SULFATE (PF) 4 MG/ML IV SOLN
4.0000 mg | INTRAVENOUS | Status: DC | PRN
  Administered 2022-09-16: 4 mg via INTRAVENOUS
  Filled 2022-09-16: qty 1

## 2022-09-16 MED ORDER — ASPIRIN 81 MG PO TBEC
81.0000 mg | DELAYED_RELEASE_TABLET | Freq: Every day | ORAL | Status: DC
  Administered 2022-09-17 – 2022-09-18 (×2): 81 mg via ORAL
  Filled 2022-09-16 (×2): qty 1

## 2022-09-16 MED ORDER — LORAZEPAM 0.5 MG PO TABS: 0.5000 mg | ORAL_TABLET | Freq: Four times a day (QID) | ORAL | Status: DC | PRN

## 2022-09-16 MED ORDER — ONDANSETRON HCL 4 MG/2ML IJ SOLN: 4.0000 mg | Freq: Four times a day (QID) | INTRAMUSCULAR | Status: DC | PRN

## 2022-09-16 MED ORDER — INSULIN ASPART 100 UNIT/ML IJ SOLN
0.0000 [IU] | Freq: Three times a day (TID) | INTRAMUSCULAR | Status: DC
  Administered 2022-09-17: 3 [IU] via SUBCUTANEOUS
  Administered 2022-09-17: 2 [IU] via SUBCUTANEOUS
  Administered 2022-09-18: 5 [IU] via SUBCUTANEOUS

## 2022-09-16 NOTE — ED Provider Notes (Signed)
[facility] Provider Note   CSN: [id number] Arrival date & time: 09/16/22  1235     History  Chief Complaint  Patient presents with   Chest Pain    Jose Orr is a 67 y.o. male. With past medical history of diabetes, hypertension, hyperlipidemia who presents to the emergency department with chest pain.  States symptoms began around 11:15am. Describes having somewhat sudden onset of epigastric dull pain that then progressively worsened and migrated to the right chest and back. He also began to feel short of breath at the time and lightheaded when standing. He states that he attempted to lay down but felt more short of breath so he got back up. Began having dull aching neck pain. States symptoms have persisted so he presents here. Currently still having chest pain. He took a baby aspirin prior to coming. Denies palpitations, nausea, diaphoresis, numbness or tingling to his arms or legs, syncope.    Chest Pain Associated symptoms: shortness of breath        Home Medications Prior to Admission medications   Medication Sig Start Date End Date Taking? Authorizing Provider  acetaminophen (TYLENOL) 500 MG tablet Take 1,000 mg by mouth every 4 (four) hours as needed.    [provider]  allopurinol (ZYLOPRIM) 300 MG tablet Take 300 mg by mouth daily.    [provider]  atenolol-chlorthalidone (TENORETIC) 50-25 MG per tablet Take 1 tablet by mouth daily.    [provider]  gabapentin (NEURONTIN) 300 MG capsule TK 2 CS PO B BED FOR DIABETIC NERVE PAIN 06/22/17   [provider]  LORazepam (ATIVAN) 1 MG tablet TK 1/2 TO 1 T PO UP TO BID PRA 05/21/16   [provider]  metFORMIN (GLUCOPHAGE-XR) 500 MG 24 hr tablet Take 500 mg by mouth 2 (two) times daily.     [provider]  NAPROXEN PO Take 1 tablet by mouth daily at 8 pm.    [provider]  oxyCODONE-acetaminophen (PERCOCET)  10-325 MG tablet Take 1 tablet by mouth every 8 (eight) hours as needed for pain. 06/05/17   Wallene Huh, DPM  oxyCODONE-acetaminophen (ROXICET) 5-325 MG tablet Take 1-2 tablets by mouth every 4 (four) hours as needed for severe pain. 10/25/15   Leta Baptist, MD      Allergies    Prednisone    Review of Systems   Review of Systems  Respiratory:  Positive for shortness of breath.   Cardiovascular:  Positive for chest pain.  Neurological:  Positive for light-headedness.  All other systems reviewed and are negative.   Physical Exam Updated Vital Signs BP 124/69   Pulse 66   Temp 98.3 F (36.8 C) (Oral)   Resp 20   Ht 6' 0.01" (1.829 m)   Wt 120.2 kg   SpO2 96%   BMI 35.93 kg/m  Physical Exam Vitals and nursing note reviewed.  Constitutional:      General: He is not in acute distress.    Appearance: Normal appearance. He is obese. He is not ill-appearing, toxic-appearing or diaphoretic.  HENT:     Head: Normocephalic.  Eyes:     General: No scleral icterus. Neck:     Vascular: No JVD.  Cardiovascular:     Rate and Rhythm: Normal rate and regular rhythm.     Pulses:          Radial pulses are 2+ on the right side and 2+ on the left side.  Heart sounds: Normal heart sounds. No murmur heard. Pulmonary:     Effort: Pulmonary effort is normal. No tachypnea.     Breath sounds: Normal breath sounds.     Comments: Taking shallow breaths  Chest:     Chest wall: No tenderness.  Abdominal:     General: Bowel sounds are normal.     Palpations: Abdomen is soft.  Musculoskeletal:     Cervical back: Normal range of motion.     Right lower leg: No edema.     Left lower leg: No edema.  Skin:    General: Skin is warm and dry.     Capillary Refill: Capillary refill takes less than 2 seconds.  Neurological:     General: No focal deficit present.     Mental Status: He is alert and oriented to person, place, and time.  Psychiatric:        Mood and Affect: Mood normal.         Behavior: Behavior normal.     ED Results / Procedures / Treatments   Labs (all labs ordered are listed, but only abnormal results are displayed) Labs Reviewed  BASIC METABOLIC PANEL - Abnormal; Notable for the following components:      Result Value   Sodium 134 (*)    Glucose, Bld 182 (*)    All other components within normal limits  TROPONIN I (HIGH SENSITIVITY) - Abnormal; Notable for the following components:   Troponin I (High Sensitivity) 88 (*)    All other components within normal limits  TROPONIN I (HIGH SENSITIVITY) - Abnormal; Notable for the following components:   Troponin I (High Sensitivity) 80 (*)    All other components within normal limits  CBC  D-DIMER, QUANTITATIVE  HEPARIN LEVEL (UNFRACTIONATED)  TROPONIN I (HIGH SENSITIVITY)    EKG EKG Interpretation  Date/Time:  Sunday September 16 2022 13:04:21 EDT Ventricular Rate:  71 PR Interval:  196 QRS Duration: 118 QT Interval:  416 QTC Calculation: 453 R Axis:   62 Text Interpretation: Sinus rhythm Nonspecific intraventricular conduction delay Minimal ST elevation, inferior leads simlar to earlier today More pronounced ST elevation in lead II. Confirmed by Ezequiel Essex [phone]) on 09/16/2022 1:18:56 PM  Radiology DG Chest 2 View  Result Date: 09/16/2022 CLINICAL DATA:  chest pain EXAM: CHEST - 2 VIEW COMPARISON:  12/24/2008 FINDINGS: Cardiac silhouette enlarged. No evidence of pneumothorax or pleural effusion. No evidence of pulmonary edema. Aorta is calcified. No osseous abnormalities identified. IMPRESSION: Enlarged cardiac silhouette. Electronically Signed   By: Sammie Bench M.D.   On: 09/16/2022 13:11    Procedures Procedures   Medications Ordered in ED Medications  nitroGLYCERIN (NITROSTAT) SL tablet 0.4 mg (0.4 mg Sublingual Given 09/16/22 1412)  heparin ADULT infusion 100 units/mL (25000 units/218mL) (1,200 Units/hr Intravenous New Bag/Given 09/16/22 1416)  aspirin EC tablet 81 mg (has no  administration in time range)  lisinopril (ZESTRIL) tablet 5 mg (has no administration in time range)  aspirin chewable tablet 324 mg (243 mg Oral Given 09/16/22 1315)  heparin bolus via infusion 4,000 Units (4,000 Units Intravenous Bolus from Bag 09/16/22 1417)    ED Course/ Medical Decision Making/ A&P Clinical Course as of 09/16/22 1518  Sun Sep 16, 2022  1335 Troponin I (High Sensitivity)(!) [LA]  1335 Dr. Wyvonnia Dusky on the phone with cards now [LA]  33 Per Dr. Virgina Jock, cardiology - not calling STEMI. Trend trop. Giving heparin, SL nitro. Recommends admit to medicine.  [LA]    Clinical  Course User Index [LA] Mickie Hillier, PA-C   {            HEART Score: 8                Medical Decision Making Amount and/or Complexity of Data Reviewed Labs: ordered. Decision-making details documented in ED Course. Radiology: ordered.  Risk OTC drugs. Prescription drug management. Decision regarding hospitalization.  Initial Impression and Ddx 67 year old male who presents with chest pain and shortness of breath  Patient PMH that increases complexity of ED encounter:  HTN, HLD, T2DM Differential: Acute chest syndrome, stable angina, atypical angina, pulmonary embolism, pneumothorax, aortic dissection, pleural effusion, CHF, COPD, asthma, myocarditis, pericarditis, cardiac tamponade, chest wall pain   Interpretation of Diagnostics I independent reviewed and interpreted the labs as followed: troponin 88>80. Dimer negative  - I independently visualized the following imaging with scope of interpretation limited to determining acute life threatening conditions related to emergency care: CXR, which revealed cardiomegaly.   Patient Reassessment and Ultimate Disposition/Management Overall well appearing and hemodynamically stable on initial presentation. His story is concerning for ACS or pleuritic type pain.  His initial EKG with possible inferior lead elevation. This was repeating and lead  II appears to be abnormal. We immediatley contacted cardiology for interpretation. Dr. Wyvonnia Dusky also evaluation the patient on arrival.  Obtaining ACS work up and adding on d-dimer. No murmur on exam. Pulses are equal.   1320: CXR with cardiomegaly.  Initial troponin resulted at 88 while Dr. Wyvonnia Dusky on phone with cards to discuss EKG findings. They recommend repeated troponin now, likely heparin gtt but do not feel it meets STEMI criteria at this time.  Repeated trop 80. Delta -8. No chest pain at this time. He was given asa, ntg, on heparin gtt. Cards recommends hospitalist admission.   - consulted and spoke with Dr. Roosevelt Locks, hospitalist who agrees to admit patient. Patient and wife have been updated on plan of care and agree to admission.    Patient management required discussion with the following services or consulting groups:  Hospitalist Service and Cardiology  Complexity of Problems Addressed Acute illness or injury that poses threat of life of bodily function  Additional Data Reviewed and Analyzed Further history obtained from: Further history from spouse/family member, Past medical history and medications listed in the EMR, and Care Everywhere  Patient Encounter Risk Assessment Prescriptions and Consideration of hospitalization  Final Clinical Impression(s) / ED Diagnoses Final diagnoses:  Chest pain, unspecified type    Rx / DC Orders ED Discharge Orders     None         Mickie Hillier, PA-C 09/16/22 1519    Ezequiel Essex, MD 09/16/22 1656

## 2022-09-16 NOTE — ED Notes (Signed)
George with [facility] called for transport 

## 2022-09-16 NOTE — ED Triage Notes (Signed)
About 1115 today, started having unusual chest pain in his right chest and across to the left, having hard time catching breath. Lying down made the pain worse,esp in his back.

## 2022-09-16 NOTE — Progress Notes (Signed)
ANTICOAGULATION CONSULT NOTE - Initial Consult  Pharmacy Consult for heparin  Indication: chest pain/ACS  Allergies  Allergen Reactions   Prednisone     Pt stated, "Made me feel agitated"    Patient Measurements: Weight: 120.2 kg (265 lb) Heparin Dosing Weight: 104kg   Vital Signs: Temp: 98.3 F (36.8 C) (03/24 1245) Temp Source: Oral (03/24 1245) BP: 139/100 (03/24 1245) Pulse Rate: 68 (03/24 1250)  Labs: Recent Labs    09/16/22 1249 09/16/22 1320  HGB 14.0  --   HCT 40.9  --   PLT 192  --   CREATININE  --  0.83  TROPONINIHS 88*  --     CrCl cannot be calculated (Unknown ideal weight.).   Medical History: Past Medical History:  Diagnosis Date   Diabetes mellitus without complication ([place])    Gout    last episode 2010, taking Allopurinol   Hyperlipidemia    Hypertension    Tongue cancer ([place])    Wears glasses    Assessment: Patient admitted for CC of chest pain. Trop elevation to 88. CBC stable. Not on anticoag PTA. Pharmacy consulted to dose heparin.   Goal of Therapy:  Heparin level 0.3-0.7 units/ml Monitor platelets by anticoagulation protocol: Yes   Plan:  Give 4000 units bolus x 1 Start heparin infusion at 1200 units/hr Check anti-Xa level in 6 hours and daily while on heparin Continue to monitor H&H and platelets  Esmeralda Arthur, PharmD, BCCCP  09/16/2022,1:56 PM

## 2022-09-16 NOTE — H&P (Signed)
History and Physical    Jose Orr [id number] DOB: [birth year] DOA: 09/16/2022  PCP: Gaynelle Arabian, MD (Confirm with patient/family/NH records and if not entered, this has to be entered at [facility] point of entry) Patient coming from: Home  I have personally briefly reviewed patient's old medical records in [place]  Chief Complaint: Chest pain  HPI: Jose Orr is a 67 y.o. male with medical history significant of HTN, IIDM with chronic diabetic neuropathy, obesity, presented with new onset chest pain.  Patient woke up this morning, ate breakfast while watching TV then started to feel pressure-like chest pain started on right shoulder radiating to the right side of the mid chest associated with shortness of breath feeling nausea and lightheadedness and blurry vision.  Chest pain get worse when lying down or taking deep breath.  Symptoms lasted about 1 hour and subsided.  Around 11 AM, the chest pain came back again 10/10, centrally located radiating to the right shoulder, " could not move the right shoulder and chest because of the pain" and decided to come to the ED.  ED Course: Patient was found to be borderline bradycardia, which his wife said is normal for him, blood pressure 120/68, O2 saturation 96% on room air.  Chest x-ray showed enlarged cardiac silhouette.  EKG showed minimum ST elevation in lead II lead III, troponin trending 80> 70> 90.  Patient was given nitroglycerin x 2, on first occasion his chest pain subsided but the second time when nitroglycerin was given his chest pain persisted.  On arrival to [facility] telemetry floor, patient continued to have 10/10 chest pain associated with shortness of breath and blurry vision.  Cardiology consulted in the ED and recommended heparin drip.  Review of Systems: As per HPI otherwise 14 point review of systems negative.    Past Medical History:  Diagnosis Date   Diabetes mellitus without complication ([place])    Gout    last  episode 2010, taking Allopurinol   Hyperlipidemia    Hypertension    Tongue cancer ([place])    Wears glasses     Past Surgical History:  Procedure Laterality Date   EXCISION OF TONGUE LESION Left 10/27/2013   Procedure: LEFT PARTIAL GLOSSECTOMY;  Surgeon: Ascencion Dike, MD;  Location: [place];  Service: ENT;  Laterality: Left;   EXCISION OF TONGUE LESION N/A 10/25/2015   Procedure: PARTIAL GLOSSECTOMY;  Surgeon: Leta Baptist, MD;  Location: [place];  Service: ENT;  Laterality: N/A;   KNEE ARTHROSCOPY Bilateral [id number]   multiple surgeries   TONGUE BIOPSY     x3   TONSILLECTOMY       reports that he has never smoked. He has never used smokeless tobacco. He reports current alcohol use. He reports that he does not use drugs.  Allergies  Allergen Reactions   Prednisone     Pt stated, "Made me feel agitated"    Family History  Problem Relation Age of Onset   Pneumonia Mother    Stroke Father      Prior to Admission medications   Medication Sig Start Date End Date Taking? Authorizing Provider  acetaminophen (TYLENOL) 500 MG tablet Take 1,000 mg by mouth every 4 (four) hours as needed.    [provider]  allopurinol (ZYLOPRIM) 300 MG tablet Take 300 mg by mouth daily.    [provider]  atenolol-chlorthalidone (TENORETIC) 50-25 MG per tablet Take 1 tablet by mouth daily.    [provider]  gabapentin (NEURONTIN) 300 MG capsule TK 2 CS PO B BED FOR DIABETIC NERVE PAIN 06/22/17   [provider]  LORazepam (ATIVAN) 1 MG tablet TK 1/2 TO 1 T PO UP TO BID PRA 05/21/16   [provider]  metFORMIN (GLUCOPHAGE-XR) 500 MG 24 hr tablet Take 500 mg by mouth 2 (two) times daily.     [provider]  NAPROXEN PO Take 1 tablet by mouth daily at 8 pm.    [provider]  oxyCODONE-acetaminophen (PERCOCET) 10-325 MG tablet Take 1 tablet by mouth every 8 (eight) hours as needed for pain. 06/05/17   Wallene Huh, DPM  oxyCODONE-acetaminophen (ROXICET) 5-325 MG tablet Take 1-2 tablets by mouth every 4 (four) hours as needed for severe pain. 10/25/15   Leta Baptist, MD    Physical Exam: Vitals:   09/16/22 1500 09/16/22 1556 09/16/22 1649 09/16/22 1800  BP: 125/66  134/69 (!) 108/59  Pulse: 71  71 64  Resp: 18  16 18   Temp:  98.4 F (36.9 C) 98.2 F (36.8 C)   TempSrc:  Oral Oral   SpO2: 97%  96% 97%  Weight:      Height:        Constitutional: NAD, calm, comfortable Vitals:   09/16/22 1500 09/16/22 1556 09/16/22 1649 09/16/22 1800  BP: 125/66  134/69 (!) 108/59  Pulse: 71  71 64  Resp: 18  16 18   Temp:  98.4 F (36.9 C) 98.2 F (36.8 C)   TempSrc:  Oral Oral   SpO2: 97%  96% 97%  Weight:      Height:       Eyes: PERRL, lids and conjunctivae normal ENMT: Mucous membranes are moist. Posterior pharynx clear of any exudate or lesions.Normal dentition.  Neck: normal, supple, no masses, no thyromegaly Respiratory: clear to auscultation bilaterally, no wheezing, no crackles. Normal respiratory effort. No accessory muscle use.  Cardiovascular: Regular rate and rhythm, no murmurs / rubs / gallops. No extremity edema. 2+ pedal pulses. No carotid bruits. Tenderness on chest wall, sternum, right shoulder Abdomen: no tenderness, no masses palpated. No hepatosplenomegaly. Bowel sounds positive.  Musculoskeletal: no clubbing / cyanosis. No joint deformity upper and lower extremities. Good ROM, no contractures. Normal muscle tone.  Skin: no rashes, lesions, ulcers. No induration Neurologic: CN 2-12 grossly intact. Sensation intact, DTR normal. Strength 5/5 in all 4.  Psychiatric: Normal judgment and insight. Alert and oriented x 3. Normal mood.     Labs on Admission: I have personally reviewed following labs and imaging studies  CBC: Recent Labs  Lab 09/16/22 1249  WBC 10.4  HGB 14.0  HCT 40.9  MCV 91.5  PLT [id number]   Basic Metabolic Panel: Recent Labs  Lab 09/16/22 1320  NA 134*   K 3.7  CL 98  CO2 27  GLUCOSE 182*  BUN 16  CREATININE 0.83  CALCIUM 9.6   GFR: Estimated Creatinine Clearance: 114 mL/min (by C-G formula based on SCr of 0.83 mg/dL). Liver Function Tests: No results for input(s): "AST", "ALT", "ALKPHOS", "BILITOT", "PROT", "ALBUMIN" in the last 168 hours. No results for input(s): "LIPASE", "AMYLASE" in the last 168 hours. No results for input(s): "AMMONIA" in the last 168 hours. Coagulation Profile: No results for input(s): "INR", "PROTIME" in the last 168 hours. Cardiac Enzymes: No results for input(s): "CKTOTAL", "CKMB", "CKMBINDEX", "TROPONINI" in the last 168 hours. BNP (last 3 results) No results for input(s): "PROBNP" in the last 8760 hours. HbA1C: No results  for input(s): "HGBA1C" in the last 72 hours. CBG: No results for input(s): "GLUCAP" in the last 168 hours. Lipid Profile: No results for input(s): "CHOL", "HDL", "LDLCALC", "TRIG", "CHOLHDL", "LDLDIRECT" in the last 72 hours. Thyroid Function Tests: No results for input(s): "TSH", "T4TOTAL", "FREET4", "T3FREE", "THYROIDAB" in the last 72 hours. Anemia Panel: No results for input(s): "VITAMINB12", "FOLATE", "FERRITIN", "TIBC", "IRON", "RETICCTPCT" in the last 72 hours. Urine analysis:    Component Value Date/Time   COLORURINE YELLOW 12/24/2008 1201   APPEARANCEUR CLEAR 12/24/2008 1201   LABSPEC 1.033 (H) 12/24/2008 1201   PHURINE 5.5 12/24/2008 1201   GLUCOSEU 100 (A) 12/24/2008 1201   HGBUR TRACE (A) 12/24/2008 1201   BILIRUBINUR NEGATIVE 12/24/2008 1201   KETONESUR 15 (A) 12/24/2008 1201   PROTEINUR 100 (A) 12/24/2008 1201   UROBILINOGEN 1.0 12/24/2008 1201   NITRITE NEGATIVE 12/24/2008 1201   LEUKOCYTESUR NEGATIVE 12/24/2008 1201    Radiological Exams on Admission: DG Chest 2 View  Result Date: 09/16/2022 CLINICAL DATA:  chest pain EXAM: CHEST - 2 VIEW COMPARISON:  12/24/2008 FINDINGS: Cardiac silhouette enlarged. No evidence of pneumothorax or pleural effusion. No  evidence of pulmonary edema. Aorta is calcified. No osseous abnormalities identified. IMPRESSION: Enlarged cardiac silhouette. Electronically Signed   By: Sammie Bench M.D.   On: 09/16/2022 13:11    EKG: Independently reviewed.  Minimum ST elevation in lead II and III, sinus  Assessment/Plan Principal Problem:   NSTEMI (non-ST elevated myocardial infarction) ([place]) Active Problems:   HTN (hypertension)  (please populate well all problems here in Problem List. (For example, if patient is on BP meds at home and you resume or decide to hold them, it is a problem that needs to be her. Same for CAD, COPD, HLD and so on)  NSTEMI -Continue ACS meds including aspirin, heparin drip as per recommended by cardiology Dr. Virgina Jock -Bradycardia now, will hold off beta-blocker -Start low-dose of lisinopril -Alternate nitroglycerin and morphine for chest pain -Echocardiogram and continue to trend troponin tonight and tomorrow morning -Other DDx, D-dimer WNL, low suspicion for PE.  Bilateral pulses equal, low suspicion for dissection.  May also has element of musculoskeletal pain, as there is a significant tenderness on palpation.  Trial of lidocaine patch.  HTN -Small dose of lisinopril  IIDM -Hold off metformin -Start sliding scale  Diabetic neuropathy -Continue at bedtime gabapentin  Obesity -BMI= 35 -Recommend calorie control  DVT prophylaxis: Heparin drip Code Status: Full code Family Communication: Wife at bedside Disposition Plan: Expect more than 2 midnight hospital stay, for NSTEMI requiring inpatient cardiology workup Consults called: Cardiology Admission status: PCU  Lequita Halt MD [facility] Pager [phone]  09/16/2022, 6:27 PM

## 2022-09-16 NOTE — Significant Event (Addendum)
Rapid Response Event Note   Reason for Call :  Chest pain, unrelieved by SL nitro x2. Hypotension following SL Nitro.  Pt with 5/10 CP, he received SL nitro x2. Following second SL Nitro pt became pale, diaphoretic, dizzy and hypotensive.   Initial Focused Assessment:  Pt lying in bed, AO. Lung sounds are clear. Pt endorsing center chest pain, that worsens with palpation. He also endorses neck and upper back pain. Heart rate is regular.  Peripheral pulses 2+. Skin is warm, moist, pale.   VS: T 16F, BP 108/59, HT 64, RR 18, SpO2 97% on 4LNC  Interventions:  -EKG -500cc IVF bolus, pt received 200cc IVF and BP resolved -K-Pad -Morhpine 4mg  IV  Plan of Care:  -Establish and maintain second IV -PRN morphine for pain -Heating pad (K-PAD) ordered for neck and back pain  Call rapid response for additional needs  Event Summary:  MD Notified: Dr. Roosevelt Locks Call Time: [id number] Arrival Time: [id number] End Time: [place], RN

## 2022-09-16 NOTE — ED Triage Notes (Signed)
Pt took 81 mg asp at 1205 today

## 2022-09-16 NOTE — Progress Notes (Signed)
[place] for heparin  Indication: chest pain/ACS  Allergies  Allergen Reactions   Prednisone     Pt stated, "Made me feel agitated"    Patient Measurements: Height: 6' 0.01" (182.9 cm) Weight: 120.2 kg (264 lb 15.9 oz) IBW/kg (Calculated) : 77.62 Heparin Dosing Weight: 104kg   Vital Signs: Temp: 98.2 F (36.8 C) (03/24 1649) Temp Source: Oral (03/24 1649) BP: 115/56 (03/24 2043) Pulse Rate: 70 (03/24 2043)  Labs: Recent Labs    09/16/22 1249 09/16/22 1320 09/16/22 1350 09/16/22 1547 09/16/22 1636 09/16/22 2017  HGB 14.0  --   --   --   --   --   HCT 40.9  --   --   --   --   --   PLT 192  --   --   --   --   --   HEPARINUNFRC  --   --   --   --   --  0.23*  CREATININE  --  0.83  --   --   --   --   CKTOTAL  --   --   --   --   --  54  TROPONINIHS 88*  --  80* 70* 99*  --      Estimated Creatinine Clearance: 114 mL/min (by C-G formula based on SCr of 0.83 mg/dL).   Medical History: Past Medical History:  Diagnosis Date   Diabetes mellitus without complication ([place])    Gout    last episode 2010, taking Allopurinol   Hyperlipidemia    Hypertension    Tongue cancer ([place])    Wears glasses    Assessment: Patient admitted for CC of chest pain. Trop elevation to 88. CBC stable. Not on anticoag PTA. Pharmacy consulted to dose heparin.   Initial heparin level is subtherapeutic at 0.23. Per RN, no issues with infusion or bleeding. CBC wnl  Goal of Therapy:  Heparin level 0.3-0.7 units/ml Monitor platelets by anticoagulation protocol: Yes   Plan:  Give 1500 units bolus x 1 Increase heparin infusion to 1400 units/hr Check anti-Xa level in 6 hours and daily while on heparin Continue to monitor H&H and platelets   Thank you for allowing [place] to participate in this patients care. Jens Som, PharmD 09/16/2022 9:14 PM  **Pharmacist phone directory can be found on [email or web address] listed under [place]**

## 2022-09-17 ENCOUNTER — Encounter (HOSPITAL_COMMUNITY)
Admission: EM | Disposition: A | Discharge status: Home / Self Care | Payer: Self-pay | Source: Home / Self Care | Attending: Internal Medicine

## 2022-09-17 ENCOUNTER — Inpatient Hospital Stay (HOSPITAL_COMMUNITY): Payer: Medicare Other

## 2022-09-17 DIAGNOSIS — I214 Non-ST elevation (NSTEMI) myocardial infarction: Secondary | ICD-10-CM

## 2022-09-17 DIAGNOSIS — E119 Type 2 diabetes mellitus without complications: Secondary | ICD-10-CM

## 2022-09-17 DIAGNOSIS — E669 Obesity, unspecified: Secondary | ICD-10-CM | POA: Insufficient documentation

## 2022-09-17 DIAGNOSIS — G629 Polyneuropathy, unspecified: Secondary | ICD-10-CM

## 2022-09-17 HISTORY — PX: CORONARY PRESSURE/FFR STUDY: CATH118243

## 2022-09-17 HISTORY — PX: LEFT HEART CATH AND CORONARY ANGIOGRAPHY: CATH118249

## 2022-09-17 LAB — ECHOCARDIOGRAM COMPLETE
AR max vel: 2.68 cm2
AV Area VTI: 3.06 cm2
AV Area mean vel: 2.82 cm2
AV Mean grad: 6 mmHg
AV Peak grad: 11.2 mmHg
Ao pk vel: 1.67 m/s
Area-P 1/2: 3.7 cm2
Height: 72.008 in
MV VTI: 3.8 cm2
S' Lateral: 3.6 cm
Weight: 4239.89 oz

## 2022-09-17 LAB — BASIC METABOLIC PANEL
Anion gap: 12 (ref 5–15)
BUN: 17 mg/dL (ref 8–23)
CO2: 23 mmol/L (ref 22–32)
Calcium: 8.5 mg/dL — ABNORMAL LOW (ref 8.9–10.3)
Chloride: 99 mmol/L (ref 98–111)
Creatinine, Ser: 1.23 mg/dL (ref 0.61–1.24)
GFR, Estimated: >60 mL/min (ref >60)
Glucose, Bld: 145 mg/dL — ABNORMAL HIGH (ref 70–99)
Potassium: 3.4 mmol/L — ABNORMAL LOW (ref 3.5–5.1)
Sodium: 134 mmol/L — ABNORMAL LOW (ref 135–145)

## 2022-09-17 LAB — GLUCOSE, CAPILLARY
Glucose-Capillary: 162 mg/dL — ABNORMAL HIGH (ref 70–99)
Glucose-Capillary: 135 mg/dL — ABNORMAL HIGH (ref 70–99)
Glucose-Capillary: 118 mg/dL — ABNORMAL HIGH (ref 70–99)
Glucose-Capillary: 113 mg/dL — ABNORMAL HIGH (ref 70–99)

## 2022-09-17 LAB — CBC
HCT: 36.6 % — ABNORMAL LOW (ref 39.0–52.0)
Hemoglobin: 12.9 g/dL — ABNORMAL LOW (ref 13.0–17.0)
MCH: 31.4 pg (ref 26.0–34.0)
MCHC: 35.2 g/dL (ref 30.0–36.0)
MCV: 89.1 fL (ref 80.0–100.0)
Platelets: 188 10*3/uL (ref 150–400)
RBC: 4.11 MIL/uL — ABNORMAL LOW (ref 4.22–5.81)
RDW: 13.9 % (ref 11.5–15.5)
WBC: 9.8 10*3/uL (ref 4.0–10.5)
nRBC: 0 % (ref 0.0–0.2)

## 2022-09-17 LAB — TROPONIN I (HIGH SENSITIVITY): Troponin I (High Sensitivity): 79 ng/L — ABNORMAL HIGH (ref <18)

## 2022-09-17 LAB — HEPARIN LEVEL (UNFRACTIONATED)
Heparin Unfractionated: 0.32 IU/mL (ref 0.30–0.70)
Heparin Unfractionated: 0.3 IU/mL (ref 0.30–0.70)

## 2022-09-17 SURGERY — LEFT HEART CATH AND CORONARY ANGIOGRAPHY
Anesthesia: LOCAL

## 2022-09-17 MED ORDER — HEPARIN SODIUM (PORCINE) 1000 UNIT/ML IJ SOLN
INTRAMUSCULAR | Status: DC | PRN
  Administered 2022-09-17: 2000 [IU] via INTRAVENOUS
  Administered 2022-09-17: 6000 [IU] via INTRAVENOUS
  Administered 2022-09-17: 4000 [IU] via INTRAVENOUS

## 2022-09-17 MED ORDER — VERAPAMIL HCL 2.5 MG/ML IV SOLN
INTRAVENOUS | Status: AC
  Filled 2022-09-17: qty 2

## 2022-09-17 MED ORDER — SODIUM CHLORIDE 0.9 % IV SOLN: 250.0000 mL | INTRAVENOUS | Status: DC | PRN

## 2022-09-17 MED ORDER — MIDAZOLAM HCL 2 MG/2ML IJ SOLN
INTRAMUSCULAR | Status: AC
  Filled 2022-09-17: qty 2

## 2022-09-17 MED ORDER — SODIUM CHLORIDE 0.9% FLUSH: 3.0000 mL | INTRAVENOUS | Status: DC | PRN

## 2022-09-17 MED ORDER — LABETALOL HCL 5 MG/ML IV SOLN: 10.0000 mg | INTRAVENOUS | Status: AC | PRN

## 2022-09-17 MED ORDER — HYDRALAZINE HCL 20 MG/ML IJ SOLN: 10.0000 mg | INTRAMUSCULAR | Status: AC | PRN

## 2022-09-17 MED ORDER — MIDAZOLAM HCL 2 MG/2ML IJ SOLN
INTRAMUSCULAR | Status: DC | PRN
  Administered 2022-09-17 (×2): 1 mg via INTRAVENOUS

## 2022-09-17 MED ORDER — SODIUM CHLORIDE 0.9% FLUSH
3.0000 mL | Freq: Two times a day (BID) | INTRAVENOUS | Status: DC
  Administered 2022-09-18: 3 mL via INTRAVENOUS

## 2022-09-17 MED ORDER — SODIUM CHLORIDE 0.9 % IV SOLN: INTRAVENOUS | Status: AC

## 2022-09-17 MED ORDER — HEPARIN SODIUM (PORCINE) 1000 UNIT/ML IJ SOLN
INTRAMUSCULAR | Status: AC
  Filled 2022-09-17: qty 10

## 2022-09-17 MED ORDER — SODIUM CHLORIDE 0.9% FLUSH
3.0000 mL | Freq: Two times a day (BID) | INTRAVENOUS | Status: DC
  Administered 2022-09-17: 3 mL via INTRAVENOUS

## 2022-09-17 MED ORDER — NITROGLYCERIN 1 MG/10 ML FOR IR/CATH LAB
INTRA_ARTERIAL | Status: DC | PRN
  Administered 2022-09-17: 200 ug via INTRA_ARTERIAL
  Administered 2022-09-17 (×4): 100 ug via INTRA_ARTERIAL

## 2022-09-17 MED ORDER — SODIUM CHLORIDE 0.9 % WEIGHT BASED INFUSION: 1.0000 mL/kg/h | INTRAVENOUS | Status: DC

## 2022-09-17 MED ORDER — ONDANSETRON HCL 4 MG/2ML IJ SOLN: 4.0000 mg | Freq: Four times a day (QID) | INTRAMUSCULAR | Status: DC | PRN

## 2022-09-17 MED ORDER — LIDOCAINE HCL (PF) 1 % IJ SOLN
INTRAMUSCULAR | Status: AC
  Filled 2022-09-17: qty 30

## 2022-09-17 MED ORDER — NITROGLYCERIN 1 MG/10 ML FOR IR/CATH LAB
INTRA_ARTERIAL | Status: AC
  Filled 2022-09-17: qty 10

## 2022-09-17 MED ORDER — HEPARIN (PORCINE) IN NACL 1000-0.9 UT/500ML-% IV SOLN
INTRAVENOUS | Status: DC | PRN
  Administered 2022-09-17 (×2): 500 mL

## 2022-09-17 MED ORDER — SODIUM CHLORIDE 0.9 % IV BOLUS
INTRAVENOUS | Status: AC | PRN
  Administered 2022-09-17: 500 mL via INTRAVENOUS

## 2022-09-17 MED ORDER — SODIUM CHLORIDE 0.9 % WEIGHT BASED INFUSION: 3.0000 mL/kg/h | INTRAVENOUS | Status: DC

## 2022-09-17 MED ORDER — LIDOCAINE HCL (PF) 1 % IJ SOLN
INTRAMUSCULAR | Status: DC | PRN
  Administered 2022-09-17: 2 mL

## 2022-09-17 MED ORDER — POTASSIUM CHLORIDE CRYS ER 20 MEQ PO TBCR
40.0000 meq | EXTENDED_RELEASE_TABLET | Freq: Once | ORAL | Status: AC
  Administered 2022-09-17: 40 meq via ORAL
  Filled 2022-09-17: qty 2

## 2022-09-17 MED ORDER — SODIUM CHLORIDE 0.9 % WEIGHT BASED INFUSION: 3.0000 mL/kg/h | INTRAVENOUS | Status: AC

## 2022-09-17 MED ORDER — POTASSIUM CHLORIDE 10 MEQ/100ML IV SOLN
10.0000 meq | INTRAVENOUS | Status: DC
  Filled 2022-09-17 (×4): qty 100

## 2022-09-17 MED ORDER — FENTANYL CITRATE (PF) 100 MCG/2ML IJ SOLN
INTRAMUSCULAR | Status: AC
  Filled 2022-09-17: qty 2

## 2022-09-17 MED ORDER — ADENOSINE (DIAGNOSTIC) 140MCG/KG/MIN
INTRAVENOUS | Status: DC | PRN
  Administered 2022-09-17: 140 ug/kg/min via INTRAVENOUS

## 2022-09-17 MED ORDER — ADENOSINE 12 MG/4ML IV SOLN
INTRAVENOUS | Status: AC
  Filled 2022-09-17: qty 16

## 2022-09-17 MED ORDER — HEPARIN SODIUM (PORCINE) 5000 UNIT/ML IJ SOLN: 5000.0000 [IU] | Freq: Three times a day (TID) | INTRAMUSCULAR | Status: DC

## 2022-09-17 MED ORDER — ACETAMINOPHEN 325 MG PO TABS: 650.0000 mg | ORAL_TABLET | ORAL | Status: DC | PRN

## 2022-09-17 MED ORDER — VERAPAMIL HCL 2.5 MG/ML IV SOLN
INTRAVENOUS | Status: DC | PRN
  Administered 2022-09-17: 10 mL via INTRA_ARTERIAL

## 2022-09-17 MED ORDER — IOHEXOL 350 MG/ML SOLN
INTRAVENOUS | Status: DC | PRN
  Administered 2022-09-17: 90 mL

## 2022-09-17 MED ORDER — FENTANYL CITRATE (PF) 100 MCG/2ML IJ SOLN
INTRAMUSCULAR | Status: DC | PRN
  Administered 2022-09-17: 50 ug via INTRAVENOUS
  Administered 2022-09-17: 25 ug via INTRAVENOUS

## 2022-09-17 SURGICAL SUPPLY — 15 items
CATH INFINITI JR4 5F (CATHETERS) IMPLANT
CATH OPTITORQUE TIG 4.0 5F (CATHETERS) IMPLANT
CATH VISTA GUIDE 6FR XB3.5 (CATHETERS) IMPLANT
DEVICE RAD COMP TR BAND LRG (VASCULAR PRODUCTS) IMPLANT
GLIDESHEATH SLEND A-KIT 6F 22G (SHEATH) IMPLANT
GUIDEWIRE INQWIRE 1.5J.035X260 (WIRE) IMPLANT
GUIDEWIRE PRESSURE X 175 (WIRE) IMPLANT
INQWIRE 1.5J .035X260CM (WIRE) ×1
KIT HEART LEFT (KITS) ×1 IMPLANT
KIT HEMO VALVE WATCHDOG (MISCELLANEOUS) IMPLANT
PACK CARDIAC CATHETERIZATION (CUSTOM PROCEDURE TRAY) ×1 IMPLANT
SHEATH PROBE COVER 6X72 (BAG) IMPLANT
TRANSDUCER W/STOPCOCK (MISCELLANEOUS) ×1 IMPLANT
TUBING CIL FLEX 10 FLL-RA (TUBING) ×1 IMPLANT
WIRE HI TORQ VERSACORE-J 145CM (WIRE) IMPLANT

## 2022-09-17 NOTE — Progress Notes (Signed)
[place] for heparin  Indication: chest pain/ACS  Allergies  Allergen Reactions   Prednisone     Pt stated, "Made me feel agitated"    Patient Measurements: Height: 6' 0.01" (182.9 cm) Weight: 120.2 kg (264 lb 15.9 oz) IBW/kg (Calculated) : 77.62 Heparin Dosing Weight: 104kg   Vital Signs: Temp: 98.7 F (37.1 C) (03/25 1139) Temp Source: Oral (03/25 1139) BP: 122/62 (03/25 1139) Pulse Rate: 72 (03/25 1139)  Labs: Recent Labs    09/16/22 1249 09/16/22 1320 09/16/22 1350 09/16/22 1636 09/16/22 2017 09/16/22 2202 09/17/22 0455 09/17/22 1156  HGB 14.0  --   --   --   --   --  12.9*  --   HCT 40.9  --   --   --   --   --  36.6*  --   PLT 192  --   --   --   --   --  188  --   HEPARINUNFRC  --   --   --   --  0.23*  --  0.32 0.30  CREATININE  --  0.83  --   --   --   --  1.23  --   CKTOTAL  --   --   --   --  54  --   --   --   TROPONINIHS 88*  --    < > 99*  --  89* 79*  --    < > = values in this interval not displayed.     Estimated Creatinine Clearance: 76.9 mL/min (by C-G formula based on SCr of 1.23 mg/dL).   Medical History: Past Medical History:  Diagnosis Date   Diabetes mellitus without complication ([place])    Gout    last episode 2010, taking Allopurinol   Hyperlipidemia    Hypertension    Tongue cancer ([place])    Wears glasses    Assessment: Patient admitted for CC of chest pain. Trop elevation to 88. CBC stable. Not on anticoag PTA. Pharmacy consulted to dose heparin.   heparin level is therapeutic at 0.3. on 1400 units/hr   Goal of Therapy:  Heparin level 0.3-0.7 units/ml Monitor platelets by anticoagulation protocol: Yes   Plan:  Cont heparin 1400 units/hr Will follow plans post cath  Hildred Laser, PharmD Clinical Pharmacist **Pharmacist phone directory can now be found on [email or web address] ([email or web address]).  Listed under [place].

## 2022-09-17 NOTE — Progress Notes (Signed)
[place] for heparin  Indication: chest pain/ACS  Allergies  Allergen Reactions   Prednisone     Pt stated, "Made me feel agitated"    Patient Measurements: Height: 6' 0.01" (182.9 cm) Weight: 120.2 kg (264 lb 15.9 oz) IBW/kg (Calculated) : 77.62 Heparin Dosing Weight: 104kg   Vital Signs: Temp: 98.4 F (36.9 C) (03/25 0350) Temp Source: Oral (03/25 0350) BP: 117/52 (03/25 0350) Pulse Rate: 73 (03/25 0350)  Labs: Recent Labs    09/16/22 1249 09/16/22 1320 09/16/22 1350 09/16/22 1547 09/16/22 1636 09/16/22 2017 09/16/22 2202 09/17/22 0455  HGB 14.0  --   --   --   --   --   --  12.9*  HCT 40.9  --   --   --   --   --   --  36.6*  PLT 192  --   --   --   --   --   --  188  HEPARINUNFRC  --   --   --   --   --  0.23*  --  0.32  CREATININE  --  0.83  --   --   --   --   --   --   CKTOTAL  --   --   --   --   --  54  --   --   TROPONINIHS 88*  --    < > 70* 99*  --  89*  --    < > = values in this interval not displayed.     Estimated Creatinine Clearance: 114 mL/min (by C-G formula based on SCr of 0.83 mg/dL).   Medical History: Past Medical History:  Diagnosis Date   Diabetes mellitus without complication ([place])    Gout    last episode 2010, taking Allopurinol   Hyperlipidemia    Hypertension    Tongue cancer ([place])    Wears glasses    Assessment: Patient admitted for CC of chest pain. Trop elevation to 88. CBC stable. Not on anticoag PTA. Pharmacy consulted to dose heparin.   Initial heparin level is subtherapeutic at 0.23. Per RN, no issues with infusion or bleeding. CBC wnl  3/25 AM update:  Heparin level therapeutic   Goal of Therapy:  Heparin level 0.3-0.7 units/ml Monitor platelets by anticoagulation protocol: Yes   Plan:  Cont heparin 1400 units/hr 1200 heparin level  Narda Bonds, PharmD, BCPS Clinical Pharmacist Phone: [phone]

## 2022-09-17 NOTE — Progress Notes (Signed)
PROGRESS NOTE    Artist Zlotnick  [id number] DOB: 1956/05/20 DOA: 09/16/2022 PCP: Gaynelle Arabian, MD     Brief Narrative:  Jose Orr is a 67 y.o. male with medical history significant of HTN, IIDM with chronic diabetic neuropathy, obesity, presented with new onset chest pain.   Patient woke up this morning, ate breakfast while watching TV then started to feel pressure-like chest pain started on right shoulder radiating to the right side of the mid chest associated with shortness of breath feeling nausea and lightheadedness and blurry vision.  Chest pain get worse when lying down or taking deep breath.  Symptoms lasted about 1 hour and subsided.  Around 11 AM, the chest pain came back again 10/10, centrally located radiating to the right shoulder, "could not move the right shoulder and chest because of the pain" and decided to come to the ED.  In the emergency department, patient was found to have elevated troponin.  He was admitted for further cardiology workup, started on IV heparin.  New events last 24 hours / Subjective: Feeling a little bit better today.  Chest pain associated with taking in deep breaths.  Assessment & Plan:   Principal Problem:   NSTEMI (non-ST elevated myocardial infarction) ([place]) Active Problems:   HTN (hypertension)   DM (diabetes mellitus) ([place])   Obesity (BMI 30-39.9)   Neuropathy   Non-STEMI -Cardiology consulted -Echocardiogram pending -Heart cath today -Aspirin, IV heparin  Hypokalemia -Replace  Hypertension -Lisinopril  Diabetes mellitus with neuropathy -Sliding scale insulin    DVT prophylaxis: IV heparin   Code Status: Full code Family Communication: At bedside Disposition Plan:  Status is: Inpatient Remains inpatient appropriate because: IV heparin, heart cath today   Antimicrobials:  Anti-infectives (From admission, onward)    None        Objective: Vitals:   09/16/22 2355 09/17/22 0350 09/17/22 1010 09/17/22  1139  BP: (!) 102/55 (!) 117/52 120/67 122/62  Pulse: 74 73 71 72  Resp: 18 18    Temp: 99.1 F (37.3 C) 98.4 F (36.9 C)  98.7 F (37.1 C)  TempSrc: Oral Oral  Oral  SpO2: 95% 95% 96% 95%  Weight:      Height:        Intake/Output Summary (Last 24 hours) at 09/17/2022 1302 Last data filed at 09/17/2022 0443 Gross per 24 hour  Intake 238.84 ml  Output --  Net 238.84 ml   Filed Weights   09/16/22 1248 09/16/22 1300  Weight: 120.2 kg 120.2 kg    Examination:  General exam: Appears calm and comfortable  Respiratory system: Clear to auscultation. Respiratory effort normal. No respiratory distress. No conversational dyspnea.  Cardiovascular system: S1 & S2 heard, RRR. No murmurs. No pedal edema. Gastrointestinal system: Abdomen is nondistended, soft and nontender. Normal bowel sounds heard. Central nervous system: Alert and oriented. No focal neurological deficits. Speech clear.  Extremities: Symmetric in appearance  Skin: No rashes, lesions or ulcers on exposed skin  Psychiatry: Judgement and insight appear normal. Mood & affect appropriate.   Data Reviewed: I have personally reviewed following labs and imaging studies  CBC: Recent Labs  Lab 09/16/22 1249 09/17/22 0455  WBC 10.4 9.8  HGB 14.0 12.9*  HCT 40.9 36.6*  MCV 91.5 89.1  PLT 192 0000000   Basic Metabolic Panel: Recent Labs  Lab 09/16/22 1320 09/17/22 0455  NA 134* 134*  K 3.7 3.4*  CL 98 99  CO2 27 23  GLUCOSE 182* 145*  BUN 16 17  CREATININE 0.83 1.23  CALCIUM 9.6 8.5*   GFR: Estimated Creatinine Clearance: 76.9 mL/min (by C-G formula based on SCr of 1.23 mg/dL). Liver Function Tests: No results for input(s): "AST", "ALT", "ALKPHOS", "BILITOT", "PROT", "ALBUMIN" in the last 168 hours. No results for input(s): "LIPASE", "AMYLASE" in the last 168 hours. No results for input(s): "AMMONIA" in the last 168 hours. Coagulation Profile: No results for input(s): "INR", "PROTIME" in the last 168  hours. Cardiac Enzymes: Recent Labs  Lab 09/16/22 2017  CKTOTAL 54   BNP (last 3 results) No results for input(s): "PROBNP" in the last 8760 hours. HbA1C: No results for input(s): "HGBA1C" in the last 72 hours. CBG: Recent Labs  Lab 09/16/22 2352 09/17/22 0846 09/17/22 1138  GLUCAP 156* 162* 135*   Lipid Profile: No results for input(s): "CHOL", "HDL", "LDLCALC", "TRIG", "CHOLHDL", "LDLDIRECT" in the last 72 hours. Thyroid Function Tests: No results for input(s): "TSH", "T4TOTAL", "FREET4", "T3FREE", "THYROIDAB" in the last 72 hours. Anemia Panel: No results for input(s): "VITAMINB12", "FOLATE", "FERRITIN", "TIBC", "IRON", "RETICCTPCT" in the last 72 hours. Sepsis Labs: No results for input(s): "PROCALCITON", "LATICACIDVEN" in the last 168 hours.  No results found for this or any previous visit (from the past 240 hour(s)).    Radiology Studies: DG Chest 2 View  Result Date: 09/16/2022 CLINICAL DATA:  chest pain EXAM: CHEST - 2 VIEW COMPARISON:  12/24/2008 FINDINGS: Cardiac silhouette enlarged. No evidence of pneumothorax or pleural effusion. No evidence of pulmonary edema. Aorta is calcified. No osseous abnormalities identified. IMPRESSION: Enlarged cardiac silhouette. Electronically Signed   By: Sammie Bench M.D.   On: 09/16/2022 13:11      Scheduled Meds:  allopurinol  300 mg Oral Daily   aspirin EC  81 mg Oral Daily   gabapentin  300 mg Oral QHS   insulin aspart  0-15 Units Subcutaneous TID WC   lidocaine  1 patch Transdermal Q24H   lisinopril  2.5 mg Oral Daily   sodium chloride flush  3 mL Intravenous Q12H   Continuous Infusions:  sodium chloride     sodium chloride 3 mL/kg/hr (09/17/22 1236)   Followed by   sodium chloride     heparin 1,400 Units/hr (09/17/22 0536)   sodium chloride       LOS: 1 day   Time spent: 35 minutes   Dessa Phi, DO [facility] 09/17/2022, 1:02 PM   Available via Epic secure chat 7am-7pm After these hours,  please refer to coverage provider listed on [email or web address]

## 2022-09-17 NOTE — Progress Notes (Signed)
No epicardial CAD Probable microvascular dysfunction  Okay to stop heparin Full report to follow  Discharge home tomorrow   Vernell Leep, MD

## 2022-09-17 NOTE — H&P (View-Only) (Signed)
CARDIOLOGY CONSULT NOTE  Patient ID: Jose Orr MRN: [id number] DOB/AGE: 09-30-1955 67 y.o.  Admit date: 09/16/2022 Referring Physician: [facility] hospitalist Reason for Consultation:  Chest pain  HPI:   67 y.o. Caucasian male  with hypertension, hyperlipidemia, type 2 DM, h/o tongue cancer, now with chest pain  Patient presented to [facility] emergency room on 09/16/2022 with complaints of retrosternal chest pain radiating to both shoulders, worse with deep breathing, present at rest.  Pain initially improved with nitroglycerin, but subsequently, did not respond much to nitroglycerin.  EKG showed subtle ST elevation in lead II, 3, not meeting STEMI criteria.  Troponin was mildly elevated at 88, and remains stuttering between 80-100.  At baseline, patient has been riding a bike regularly without any complaints of chest pain or shortness of breath.  He does have family history of CAD, with his brother just having undergone CABG.  At present, patient is chest pain-free.  Past Medical History:  Diagnosis Date   Diabetes mellitus without complication ([place])    Gout    last episode 2010, taking Allopurinol   Hyperlipidemia    Hypertension    Tongue cancer ([place])    Wears glasses      Past Surgical History:  Procedure Laterality Date   EXCISION OF TONGUE LESION Left 10/27/2013   Procedure: LEFT PARTIAL GLOSSECTOMY;  Surgeon: Ascencion Dike, MD;  Location: [facility];  Service: ENT;  Laterality: Left;   EXCISION OF TONGUE LESION N/A 10/25/2015   Procedure: PARTIAL GLOSSECTOMY;  Surgeon: Leta Baptist, MD;  Location: [facility];  Service: ENT;  Laterality: N/A;   KNEE ARTHROSCOPY Bilateral 2006,2008   multiple surgeries   TONGUE BIOPSY     x3   TONSILLECTOMY        Family History  Problem Relation Age of Onset   Pneumonia Mother    Stroke Father      Social History: Social History   Socioeconomic History   Marital status: Married    Spouse name: Not on file    Number of children: Not on file   Years of education: Not on file   Highest education level: Not on file  Occupational History   Not on file  Tobacco Use   Smoking status: Never   Smokeless tobacco: Never  Substance and Sexual Activity   Alcohol use: Yes    Comment: rare   Drug use: No   Sexual activity: Not on file  Other Topics Concern   Not on file  Social History Narrative   Not on file   Social Determinants of Health   Financial Resource Strain: Not on file  Food Insecurity: No Food Insecurity (09/16/2022)   Hunger Vital Sign    Worried About Running Out of Food in the Last Year: Never true    Ran Out of Food in the Last Year: Never true  Transportation Needs: No Transportation Needs (09/16/2022)   PRAPARE - Hydrologist (Medical): No    Lack of Transportation (Non-Medical): No  Physical Activity: Not on file  Stress: Not on file  Social Connections: Not on file  Intimate Partner Violence: Not At Risk (09/16/2022)   Humiliation, Afraid, Rape, and Kick questionnaire    Fear of Current or Ex-Partner: No    Emotionally Abused: No    Physically Abused: No    Sexually Abused: No     Medications Prior to Admission  Medication Sig Dispense Refill Last Dose   acetaminophen (TYLENOL)  500 MG tablet Take 1,000 mg by mouth every 4 (four) hours as needed.      allopurinol (ZYLOPRIM) 300 MG tablet Take 300 mg by mouth daily.      atenolol-chlorthalidone (TENORETIC) 50-25 MG per tablet Take 1 tablet by mouth daily.      gabapentin (NEURONTIN) 300 MG capsule TK 2 CS PO B BED FOR DIABETIC NERVE PAIN  1    LORazepam (ATIVAN) 1 MG tablet TK 1/2 TO 1 T PO UP TO BID PRA      metFORMIN (GLUCOPHAGE-XR) 500 MG 24 hr tablet Take 500 mg by mouth 2 (two) times daily.       NAPROXEN PO Take 1 tablet by mouth daily at 8 pm.      oxyCODONE-acetaminophen (PERCOCET) 10-325 MG tablet Take 1 tablet by mouth every 8 (eight) hours as needed for pain. 20 tablet 0     oxyCODONE-acetaminophen (ROXICET) 5-325 MG tablet Take 1-2 tablets by mouth every 4 (four) hours as needed for severe pain. 45 tablet 0     Review of Systems  Cardiovascular:  Positive for chest pain (Currentl absent). Negative for dyspnea on exertion, leg swelling, palpitations and syncope.      Physical Exam: Physical Exam Vitals and nursing note reviewed.  Constitutional:      General: He is not in acute distress. Neck:     Vascular: No JVD.  Cardiovascular:     Rate and Rhythm: Normal rate and regular rhythm.     Heart sounds: Normal heart sounds. No murmur heard. Pulmonary:     Effort: Pulmonary effort is normal.     Breath sounds: Normal breath sounds. No wheezing or rales.  Musculoskeletal:     Right lower leg: No edema.     Left lower leg: No edema.        Imaging/tests reviewed and independently interpreted: Lab Results: CBC, BMP, trop HS  Cardiac Studies:  Telemetry 09/17/2022: No significant arhythmia  EKG 09/16/2022: Sinus rhythm Subtle STE lead II, III, not STEMI  Echocardiogram: Ordered   Assessment & Recommendations:  67 y.o. Caucasian male  with hypertension, hyperlipidemia, type 2 DM, h/o tongue cancer, now with chest pain  Chest pain: Pleuritic chest pain, radiating to both shoulders. Nonspecific EKG abnormality. Stuttering trop HS 80-100. Discussed stress test vs coronary angiography. Given his risk factors, patient would like to proceed with coronary angiography, possible intervention. Continue Aspirin, heparin, statin.   Discussed interpretation of tests and management recommendations with the primary team     [name], MD Pager: [phone] Office: [phone]

## 2022-09-17 NOTE — Consult Note (Signed)
CARDIOLOGY CONSULT NOTE  Patient ID: Jose Orr MRN: [id number] DOB/AGE: 69-Mar-1957 69 y.o.  Admit date: 09/16/2022 Referring Physician: [facility] hospitalist Reason for Consultation:  Chest pain  HPI:   67 y.o. Caucasian male  with hypertension, hyperlipidemia, type 2 DM, h/o tongue cancer, now with chest pain  Patient presented to [name] emergency room on 09/16/2022 with complaints of retrosternal chest pain radiating to both shoulders, worse with deep breathing, present at rest.  Pain initially improved with nitroglycerin, but subsequently, did not respond much to nitroglycerin.  EKG showed subtle ST elevation in lead II, 3, not meeting STEMI criteria.  Troponin was mildly elevated at 88, and remains stuttering between 80-100.  At baseline, patient has been riding a bike regularly without any complaints of chest pain or shortness of breath.  He does have family history of CAD, with his brother just having undergone CABG.  At present, patient is chest pain-free.  Past Medical History:  Diagnosis Date   Diabetes mellitus without complication ([place])    Gout    last episode 2010, taking Allopurinol   Hyperlipidemia    Hypertension    Tongue cancer ([place])    Wears glasses      Past Surgical History:  Procedure Laterality Date   EXCISION OF TONGUE LESION Left 10/27/2013   Procedure: LEFT PARTIAL GLOSSECTOMY;  Surgeon: Ascencion Dike, MD;  Location: [facility];  Service: ENT;  Laterality: Left;   EXCISION OF TONGUE LESION N/A 10/25/2015   Procedure: PARTIAL GLOSSECTOMY;  Surgeon: Leta Baptist, MD;  Location: [facility];  Service: ENT;  Laterality: N/A;   KNEE ARTHROSCOPY Bilateral 2006,2008   multiple surgeries   TONGUE BIOPSY     x3   TONSILLECTOMY        Family History  Problem Relation Age of Onset   Pneumonia Mother    Stroke Father      Social History: Social History   Socioeconomic History   Marital status: Married    Spouse name: Not on file    Number of children: Not on file   Years of education: Not on file   Highest education level: Not on file  Occupational History   Not on file  Tobacco Use   Smoking status: Never   Smokeless tobacco: Never  Substance and Sexual Activity   Alcohol use: Yes    Comment: rare   Drug use: No   Sexual activity: Not on file  Other Topics Concern   Not on file  Social History Narrative   Not on file   Social Determinants of Health   Financial Resource Strain: Not on file  Food Insecurity: No Food Insecurity (09/16/2022)   Hunger Vital Sign    Worried About Running Out of Food in the Last Year: Never true    Ran Out of Food in the Last Year: Never true  Transportation Needs: No Transportation Needs (09/16/2022)   PRAPARE - Hydrologist (Medical): No    Lack of Transportation (Non-Medical): No  Physical Activity: Not on file  Stress: Not on file  Social Connections: Not on file  Intimate Partner Violence: Not At Risk (09/16/2022)   Humiliation, Afraid, Rape, and Kick questionnaire    Fear of Current or Ex-Partner: No    Emotionally Abused: No    Physically Abused: No    Sexually Abused: No     Medications Prior to Admission  Medication Sig Dispense Refill Last Dose   acetaminophen (TYLENOL)  500 MG tablet Take 1,000 mg by mouth every 4 (four) hours as needed.      allopurinol (ZYLOPRIM) 300 MG tablet Take 300 mg by mouth daily.      atenolol-chlorthalidone (TENORETIC) 50-25 MG per tablet Take 1 tablet by mouth daily.      gabapentin (NEURONTIN) 300 MG capsule TK 2 CS PO B BED FOR DIABETIC NERVE PAIN  1    LORazepam (ATIVAN) 1 MG tablet TK 1/2 TO 1 T PO UP TO BID PRA      metFORMIN (GLUCOPHAGE-XR) 500 MG 24 hr tablet Take 500 mg by mouth 2 (two) times daily.       NAPROXEN PO Take 1 tablet by mouth daily at 8 pm.      oxyCODONE-acetaminophen (PERCOCET) 10-325 MG tablet Take 1 tablet by mouth every 8 (eight) hours as needed for pain. 20 tablet 0     oxyCODONE-acetaminophen (ROXICET) 5-325 MG tablet Take 1-2 tablets by mouth every 4 (four) hours as needed for severe pain. 45 tablet 0     Review of Systems  Cardiovascular:  Positive for chest pain (Currentl absent). Negative for dyspnea on exertion, leg swelling, palpitations and syncope.      Physical Exam: Physical Exam Vitals and nursing note reviewed.  Constitutional:      General: He is not in acute distress. Neck:     Vascular: No JVD.  Cardiovascular:     Rate and Rhythm: Normal rate and regular rhythm.     Heart sounds: Normal heart sounds. No murmur heard. Pulmonary:     Effort: Pulmonary effort is normal.     Breath sounds: Normal breath sounds. No wheezing or rales.  Musculoskeletal:     Right lower leg: No edema.     Left lower leg: No edema.        Imaging/tests reviewed and independently interpreted: Lab Results: CBC, BMP, trop HS  Cardiac Studies:  Telemetry 09/17/2022: No significant arhythmia  EKG 09/16/2022: Sinus rhythm Subtle STE lead II, III, not STEMI  Echocardiogram: Ordered   Assessment & Recommendations:  67 y.o. Caucasian male  with hypertension, hyperlipidemia, type 2 DM, h/o tongue cancer, now with chest pain  Chest pain: Pleuritic chest pain, radiating to both shoulders. Nonspecific EKG abnormality. Stuttering trop HS 80-100. Discussed stress test vs coronary angiography. Given his risk factors, patient would like to proceed with coronary angiography, possible intervention. Continue Aspirin, heparin, statin.   Discussed interpretation of tests and management recommendations with the primary team     Jose Mormon, MD Pager: [phone] Office: [phone]

## 2022-09-17 NOTE — Progress Notes (Signed)
*  PRELIMINARY RESULTS* Echocardiogram 2D Echocardiogram has been performed.  Elpidio Anis 09/17/2022, 9:25 AM

## 2022-09-17 NOTE — Interval H&P Note (Signed)
History and Physical Interval Note:  09/17/2022 2:11 PM  Jose Orr  has presented today for surgery, with the diagnosis of nstemi.  The various methods of treatment have been discussed with the patient and family. After consideration of risks, benefits and other options for treatment, the patient has consented to  Procedure(s): LEFT HEART CATH AND CORONARY ANGIOGRAPHY (N/A) as a surgical intervention.  The patient's history has been reviewed, patient examined, no change in status, stable for surgery.  I have reviewed the patient's chart and labs.  Questions were answered to the patient's satisfaction.    2016 Appropriate Use Criteria for Coronary Revascularization in Patients With Acute Coronary Syndrome NSTEMI/UA High Risk (TIMI Score 5-7) NSTEMI/Unstable angina, stabilized patient at high risk Link Here: sistemancia.com Indication:  Revascularization by PCI or CABG of 1 or more arteries in a patient with NSTEMI or unstable angina with Stabilization after presentation High risk for clinical events A (7) Indication: 16; Score 7 [place]

## 2022-09-17 NOTE — Care Management (Signed)
  Transition of Care [facility]) Screening Note   Patient Details  Name: Jose Orr Date of Birth: 06-May-1956   Transition of [facility]) CM/SW Contact:    Bethena Roys, RN Phone Number: 09/17/2022, 1:57 PM    Transition of Care Department [facility]) has reviewed the patient and no TOC needs have been identified at this time. Patient presented as a transfer from [facility] for chest pain- plan for [facility]. We will continue to monitor patient advancement through interdisciplinary progression rounds. If new patient transition needs arise, please place a TOC consult.

## 2022-09-18 ENCOUNTER — Encounter (HOSPITAL_COMMUNITY): Payer: Self-pay | Admitting: Cardiology

## 2022-09-18 DIAGNOSIS — R0789 Other chest pain: Secondary | ICD-10-CM

## 2022-09-18 LAB — BASIC METABOLIC PANEL
Anion gap: 11 (ref 5–15)
BUN: 9 mg/dL (ref 8–23)
CO2: 24 mmol/L (ref 22–32)
Calcium: 8.4 mg/dL — ABNORMAL LOW (ref 8.9–10.3)
Chloride: 100 mmol/L (ref 98–111)
Creatinine, Ser: 0.82 mg/dL (ref 0.61–1.24)
GFR, Estimated: >60 mL/min (ref >60)
Glucose, Bld: 120 mg/dL — ABNORMAL HIGH (ref 70–99)
Potassium: 3.6 mmol/L (ref 3.5–5.1)
Sodium: 135 mmol/L (ref 135–145)

## 2022-09-18 LAB — HEMOGLOBIN A1C
Hgb A1c MFr Bld: 6.7 % — ABNORMAL HIGH (ref 4.8–5.6)
Mean Plasma Glucose: 146 mg/dL

## 2022-09-18 LAB — CBC
HCT: 36.3 % — ABNORMAL LOW (ref 39.0–52.0)
Hemoglobin: 12.2 g/dL — ABNORMAL LOW (ref 13.0–17.0)
MCH: 30.9 pg (ref 26.0–34.0)
MCHC: 33.6 g/dL (ref 30.0–36.0)
MCV: 91.9 fL (ref 80.0–100.0)
Platelets: 184 10*3/uL (ref 150–400)
RBC: 3.95 MIL/uL — ABNORMAL LOW (ref 4.22–5.81)
RDW: 14 % (ref 11.5–15.5)
WBC: 7.8 10*3/uL (ref 4.0–10.5)
nRBC: 0 % (ref 0.0–0.2)

## 2022-09-18 LAB — GLUCOSE, CAPILLARY
Glucose-Capillary: 210 mg/dL — ABNORMAL HIGH (ref 70–99)
Glucose-Capillary: 113 mg/dL — ABNORMAL HIGH (ref 70–99)

## 2022-09-18 NOTE — Discharge Summary (Signed)
Physician Discharge Summary  [name] [id number] DOB: 1956-06-18 DOA: 09/16/2022  PCP: Gaynelle Arabian, MD  Admit date: 09/16/2022 Discharge date: 09/18/2022  Admitted From: Home Disposition:  Home  Recommendations for Outpatient Follow-up:  Follow up with cardiology as scheduled 4/8  Discharge Condition: Stable CODE STATUS: Full code Diet recommendation: Heart healthy diet  Brief/Interim Summary: Jose Orr is a 67 y.o. male with medical history significant of HTN, IIDM with chronic diabetic neuropathy, obesity, presented with new onset chest pain.   Patient woke up this morning, ate breakfast while watching TV then started to feel pressure-like chest pain started on right shoulder radiating to the right side of the mid chest associated with shortness of breath feeling nausea and lightheadedness and blurry vision.  Chest pain get worse when lying down or taking deep breath.  Symptoms lasted about 1 hour and subsided.  Around 11 AM, the chest pain came back again 10/10, centrally located radiating to the right shoulder, "could not move the right shoulder and chest because of the pain" and decided to come to the ED.   In the emergency department, patient was found to have elevated troponin.  He was admitted for further cardiology workup, started on IV heparin.  Patient underwent heart cath 8/25.  Revealed normal epicardial coronary arteries without significant epicardial disease.  Possible microvascular dysfunction.  Patient's symptoms resolved and he was discharged home in stable condition.  Discharge Diagnoses:   Principal Problem:   Atypical chest pain Active Problems:   HTN (hypertension)   DM (diabetes mellitus) ([place])   Obesity (BMI 30-39.9)   Neuropathy   Atypical chest pain -Status post heart cath as below -Appreciate cardiology -Resolved  Hypertension -Atenolol-HCTZ  Diabetes mellitus with neuropathy -Metformin, gabapentin    Hyperlipidemia -Lipitor  Discharge Instructions  Discharge Instructions     AMB referral to Phase II Cardiac Rehabilitation   Complete by: As directed    Diagnosis: NSTEMI   After initial evaluation and assessments completed: Virtual Based Care may be provided alone or in conjunction with Phase 2 Cardiac Rehab based on patient barriers.: Yes   Intensive Cardiac Rehabilitation (ICR) [place] location only OR Traditional Cardiac Rehabilitation (TCR) *If criteria for ICR are not met will enroll in TCR [facility] only): Yes   Call MD for:  difficulty breathing, headache or visual disturbances   Complete by: As directed    Call MD for:  extreme fatigue   Complete by: As directed    Call MD for:  persistant dizziness or light-headedness   Complete by: As directed    Call MD for:  persistant nausea and vomiting   Complete by: As directed    Call MD for:  severe uncontrolled pain   Complete by: As directed    Call MD for:  temperature >100.4   Complete by: As directed    Diet Carb Modified   Complete by: As directed    Discharge instructions   Complete by: As directed    You were cared for by a hospitalist during your hospital stay. If you have any questions about your discharge medications or the care you received while you were in the hospital after you are discharged, you can call the unit and ask to speak with the hospitalist on call if the hospitalist that took care of you is not available. Once you are discharged, your primary care physician will handle any further medical issues. Please note that NO REFILLS for any discharge medications will be authorized once you are  discharged, as it is imperative that you return to your primary care physician (or establish a relationship with a primary care physician if you do not have one) for your aftercare needs so that they can reassess your need for medications and monitor your lab values.   Increase activity slowly   Complete by: As directed        Allergies as of 09/18/2022       Reactions   Prednisone    Pt stated, "Made me feel agitated"        Medication List     STOP taking these medications    aspirin EC 81 MG tablet   oxyCODONE-acetaminophen 10-325 MG tablet Commonly known as: PERCOCET   oxyCODONE-acetaminophen 5-325 MG tablet Commonly known as: Roxicet       TAKE these medications    acetaminophen 500 MG tablet Commonly known as: TYLENOL Take 1,000 mg by mouth every 4 (four) hours as needed for mild pain.   allopurinol 300 MG tablet Commonly known as: ZYLOPRIM Take 300 mg by mouth daily.   atenolol-chlorthalidone 50-25 MG tablet Commonly known as: TENORETIC Take 1 tablet by mouth daily.   atorvastatin 40 MG tablet Commonly known as: LIPITOR Take 40 mg by mouth daily.   gabapentin 300 MG capsule Commonly known as: NEURONTIN Take 300 mg by mouth daily as needed (For knee pain).   LORazepam 1 MG tablet Commonly known as: ATIVAN Take 1 mg by mouth every 6 (six) hours as needed for anxiety.   metFORMIN 500 MG 24 hr tablet Commonly known as: GLUCOPHAGE-XR Take 1,500 mg by mouth daily.   NAPROXEN PO Take 1 tablet by mouth daily as needed (For pain).        Follow-up Information     [name], MD Follow up on 10/01/2022.   Specialties: Cardiology, Radiology Why: 10 AM Contact information: [place] [address] [phone]                Allergies  Allergen Reactions   Prednisone     Pt stated, "Made me feel agitated"    Consultations: Cardiology    Procedures/Studies: CARDIAC CATHETERIZATION  Addendum Date: 09/18/2022   Normal epicardial coronary arteries withotu significant epicardial disease. Slow flow in all vessels Normal LVEDP Mildly elevated IMR, suggests possible microvascular dysfunction [name], MD Pager: [phone] Office: [phone]   Result Date: 09/18/2022 Images from the original result were  not included. Normal epicardial coronary arteries withotu significant epicardial disease. Slow flow in all vessels Normal LVEDP Mildly elevated IMR, suggests possible microvascular dysfunction [name], MD Pager: [phone] Office: [phone]  ECHOCARDIOGRAM COMPLETE  Result Date: 09/17/2022    ECHOCARDIOGRAM REPORT   Patient Name:   Jose Orr Date of Exam: 09/17/2022 Medical Rec #:  [id number]     Height:       72.0 in Accession #:    [id number]    Weight:       265.0 lb Date of Birth:  [birth year]      BSA:          2.401 m Patient Age:    40 years      BP:           117/52 mmHg Patient Gender: M             HR:           67 bpm. Exam Location:  Inpatient Procedure: 2D Echo, Cardiac Doppler and Color  Doppler Indications:    NSTEMI  History:        Patient has no prior history of Echocardiogram examinations.                 Acute MI; Risk Factors:Hypertension.  Sonographer:    Wenda Low Referring Phys: [id number] [place]  1. Left ventricular ejection fraction, by estimation, is 60 to 65%. The left ventricle has normal function. The left ventricle has no regional wall motion abnormalities. Left ventricular diastolic parameters are consistent with Grade II diastolic dysfunction (pseudonormalization).  2. Right ventricular systolic function is normal. The right ventricular size is normal. There is normal pulmonary artery systolic pressure.  3. Left atrial size was mild to moderately dilated.  4. Right atrial size was mildly dilated.  5. The mitral valve is normal in structure. Trivial mitral valve regurgitation. No evidence of mitral stenosis.  6. The aortic valve is normal in structure. There is mild calcification of the aortic valve. Aortic valve regurgitation is trivial. Mild aortic valve stenosis. Aortic valve mean gradient measures 6.0 mmHg. Aortic valve Vmax measures 1.67 m/s.  7. The inferior vena cava is normal in size with greater than 50% respiratory variability,  suggesting right atrial pressure of 3 mmHg. FINDINGS  Left Ventricle: Left ventricular ejection fraction, by estimation, is 60 to 65%. The left ventricle has normal function. The left ventricle has no regional wall motion abnormalities. The left ventricular internal cavity size was normal in size. There is  no left ventricular hypertrophy. Left ventricular diastolic parameters are consistent with Grade II diastolic dysfunction (pseudonormalization). Right Ventricle: The right ventricular size is normal. No increase in right ventricular wall thickness. Right ventricular systolic function is normal. There is normal pulmonary artery systolic pressure. The tricuspid regurgitant velocity is 1.81 m/s, and  with an assumed right atrial pressure of 3 mmHg, the estimated right ventricular systolic pressure is [id number] mmHg. Left Atrium: Left atrial size was mild to moderately dilated. Right Atrium: Right atrial size was mildly dilated. Pericardium: There is no evidence of pericardial effusion. Mitral Valve: The mitral valve is normal in structure. Trivial mitral valve regurgitation. No evidence of mitral valve stenosis. MV peak gradient, 4.0 mmHg. The mean mitral valve gradient is 1.0 mmHg. Tricuspid Valve: The tricuspid valve is normal in structure. Tricuspid valve regurgitation is trivial. No evidence of tricuspid stenosis. Aortic Valve: The aortic valve is normal in structure. There is mild calcification of the aortic valve. Aortic valve regurgitation is trivial. Mild aortic stenosis is present. Aortic valve mean gradient measures 6.0 mmHg. Aortic valve peak gradient measures 11.2 mmHg. Aortic valve area, by VTI measures 3.06 cm. Pulmonic Valve: The pulmonic valve was normal in structure. Pulmonic valve regurgitation is not visualized. No evidence of pulmonic stenosis. Aorta: The aortic root is normal in size and structure. Venous: The inferior vena cava is normal in size with greater than 50% respiratory variability,  suggesting right atrial pressure of 3 mmHg. IAS/Shunts: No atrial level shunt detected by color flow Doppler.  LEFT VENTRICLE PLAX 2D LVIDd:         5.70 cm   Diastology LVIDs:         3.60 cm   LV e' medial:    7.51 cm/s LV PW:         1.50 cm   LV E/e' medial:  12.6 LV IVS:        1.30 cm   LV e' lateral:   7.83 cm/s LVOT diam:  2.10 cm   LV E/e' lateral: 12.0 LV SV:         108 LV SV Index:   45 LVOT Area:     3.46 cm  RIGHT VENTRICLE RV Basal diam:  3.90 cm RV Mid diam:    4.40 cm RV S prime:     12.00 cm/s TAPSE (M-mode): 2.4 cm LEFT ATRIUM             Index        RIGHT ATRIUM           Index LA diam:        4.60 cm 1.92 cm/m   RA Area:     20.00 cm LA Vol (A2C):   73.3 ml 30.53 ml/m  RA Volume:   61.40 ml  25.57 ml/m LA Vol (A4C):   65.9 ml 27.44 ml/m LA Biplane Vol: 72.4 ml 30.15 ml/m  AORTIC VALVE                     PULMONIC VALVE AV Area (Vmax):    2.68 cm      PV Vmax:       1.06 m/s AV Area (Vmean):   2.82 cm      PV Peak grad:  4.5 mmHg AV Area (VTI):     3.06 cm AV Vmax:           167.00 cm/s AV Vmean:          113.000 cm/s AV VTI:            0.354 m AV Peak Grad:      11.2 mmHg AV Mean Grad:      6.0 mmHg LVOT Vmax:         129.00 cm/s LVOT Vmean:        91.900 cm/s LVOT VTI:          0.313 m LVOT/AV VTI ratio: 0.88  AORTA Ao Root diam: 3.90 cm MITRAL VALVE               TRICUSPID VALVE MV Area (PHT): 3.70 cm    TR Peak grad:   13.1 mmHg MV Area VTI:   3.80 cm    TR Vmax:        181.00 cm/s MV Peak grad:  4.0 mmHg MV Mean grad:  1.0 mmHg    SHUNTS MV Vmax:       1.00 m/s    Systemic VTI:  0.31 m MV Vmean:      53.8 cm/s   Systemic Diam: 2.10 cm MV Decel Time: 205 msec MV E velocity: 94.30 cm/s MV A velocity: 88.30 cm/s MV E/A ratio:  1.07 Glori Bickers MD Electronically signed by Glori Bickers MD Signature Date/Time: 09/17/2022/1:39:44 PM    Final    DG Chest 2 View  Result Date: 09/16/2022 CLINICAL DATA:  chest pain EXAM: CHEST - 2 VIEW COMPARISON:  12/24/2008 FINDINGS:  Cardiac silhouette enlarged. No evidence of pneumothorax or pleural effusion. No evidence of pulmonary edema. Aorta is calcified. No osseous abnormalities identified. IMPRESSION: Enlarged cardiac silhouette. Electronically Signed   By: Sammie Bench M.D.   On: 09/16/2022 13:11       Discharge Exam: Vitals:   09/18/22 0620 09/18/22 0753  BP: 133/78 129/71  Pulse:  76  Resp: 15 14  Temp: 97.7 F (36.5 C)   SpO2:      General: Pt is alert, awake, not in acute distress Cardiovascular: RRR, S1/S2 +,  no edema Respiratory: CTA bilaterally, no wheezing, no rhonchi, no respiratory distress, no conversational dyspnea  Abdominal: Soft, NT, ND, bowel sounds + Extremities: no edema, no cyanosis Psych: Normal mood and affect, stable judgement and insight     The results of significant diagnostics from this hospitalization (including imaging, microbiology, ancillary and laboratory) are listed below for reference.     Microbiology: No results found for this or any previous visit (from the past 240 hour(s)).   Labs: BNP (last 3 results) No results for input(s): "BNP" in the last 8760 hours. Basic Metabolic Panel: Recent Labs  Lab 09/16/22 1320 09/17/22 0455 09/18/22 0233  NA 134* 134* 135  K 3.7 3.4* 3.6  CL 98 99 100  CO2 27 23 24   GLUCOSE 182* 145* 120*  BUN 16 17 9   CREATININE 0.83 1.23 0.82  CALCIUM 9.6 8.5* 8.4*   Liver Function Tests: No results for input(s): "AST", "ALT", "ALKPHOS", "BILITOT", "PROT", "ALBUMIN" in the last 168 hours. No results for input(s): "LIPASE", "AMYLASE" in the last 168 hours. No results for input(s): "AMMONIA" in the last 168 hours. CBC: Recent Labs  Lab 09/16/22 1249 09/17/22 0455 09/18/22 0233  WBC 10.4 9.8 7.8  HGB 14.0 12.9* 12.2*  HCT 40.9 36.6* 36.3*  MCV 91.5 89.1 91.9  PLT 192 188 184   Cardiac Enzymes: Recent Labs  Lab 09/16/22 2017  CKTOTAL 54   BNP: Invalid input(s): "POCBNP" CBG: Recent Labs  Lab 09/17/22 1138  09/17/22 1618 09/17/22 2059 09/18/22 0750 09/18/22 1135  GLUCAP 135* 118* 113* 210* 113*   D-Dimer Recent Labs    09/16/22 1315  DDIMER 0.30   Hgb A1c Recent Labs    09/16/22 2017  HGBA1C 6.7*   Lipid Profile No results for input(s): "CHOL", "HDL", "LDLCALC", "TRIG", "CHOLHDL", "LDLDIRECT" in the last 72 hours. Thyroid function studies No results for input(s): "TSH", "T4TOTAL", "T3FREE", "THYROIDAB" in the last 72 hours.  Invalid input(s): "FREET3" Anemia work up No results for input(s): "VITAMINB12", "FOLATE", "FERRITIN", "TIBC", "IRON", "RETICCTPCT" in the last 72 hours. Urinalysis    Component Value Date/Time   COLORURINE YELLOW 12/24/2008 1201   APPEARANCEUR CLEAR 12/24/2008 1201   LABSPEC 1.033 (H) 12/24/2008 1201   PHURINE 5.5 12/24/2008 1201   GLUCOSEU 100 (A) 12/24/2008 1201   HGBUR TRACE (A) 12/24/2008 1201   BILIRUBINUR NEGATIVE 12/24/2008 1201   KETONESUR 15 (A) 12/24/2008 1201   PROTEINUR 100 (A) 12/24/2008 1201   UROBILINOGEN 1.0 12/24/2008 1201   NITRITE NEGATIVE 12/24/2008 1201   LEUKOCYTESUR NEGATIVE 12/24/2008 1201   Sepsis Labs Recent Labs  Lab 09/16/22 1249 09/17/22 0455 09/18/22 0233  WBC 10.4 9.8 7.8   Microbiology No results found for this or any previous visit (from the past 240 hour(s)).   Patient was seen and examined on the day of discharge and was found to be in stable condition. Time coordinating discharge: 25 minutes including assessment and coordination of care, as well as examination of the patient.   SIGNED:  Dessa Phi, DO [facility] 09/18/2022, 12:25 PM

## 2022-09-18 NOTE — Progress Notes (Signed)
Mobility Specialist Progress Note:   09/18/22 1045  Mobility  Activity Ambulated independently in hallway  Level of Assistance Independent  Assistive Device None  Distance Ambulated (ft) 500 ft  Activity Response Tolerated well  Mobility Referral Yes  $Mobility charge 1 Mobility   Pt agreeable to mobility session. No physical assistance required. Asx throughout. Back sitting EOB with all needs met.  Nelta Numbers Mobility Specialist Please contact via SecureChat or  Rehab office at [phone]

## 2022-09-18 NOTE — Progress Notes (Signed)
Subjective:  No chest pain   Current Facility-Administered Medications:    0.9 %  sodium chloride infusion, 250 mL, Intravenous, PRN, Caleesi Kohl J, MD   acetaminophen (TYLENOL) tablet 650 mg, 650 mg, Oral, Q4H PRN, Wynetta Fines T, MD   allopurinol (ZYLOPRIM) tablet 300 mg, 300 mg, Oral, Daily, [place], Ping T, MD, 300 mg at 09/18/22 0820   aspirin EC tablet 81 mg, 81 mg, Oral, Daily, Wynetta Fines T, MD, 81 mg at 09/18/22 0820   bisacodyl (DULCOLAX) EC tablet 5 mg, 5 mg, Oral, Daily PRN, Wynetta Fines T, MD   gabapentin (NEURONTIN) capsule 300 mg, 300 mg, Oral, QHS, Wynetta Fines T, MD, 300 mg at 09/17/22 2156   heparin injection 5,000 Units, 5,000 Units, Subcutaneous, Q8H, Aylen Stradford J, MD   hydrALAZINE (APRESOLINE) injection 5 mg, 5 mg, Intravenous, Q6H PRN, Wynetta Fines T, MD   insulin aspart (novoLOG) injection 0-15 Units, 0-15 Units, Subcutaneous, TID WC, Wynetta Fines T, MD, 5 Units at 09/18/22 0820   lidocaine (LIDODERM) 5 % 1 patch, 1 patch, Transdermal, Q24H, Wynetta Fines T, MD, 1 patch at 09/17/22 1718   lisinopril (ZESTRIL) tablet 2.5 mg, 2.5 mg, Oral, Daily, Wynetta Fines T, MD, 2.5 mg at 09/18/22 0820   LORazepam (ATIVAN) tablet 0.5 mg, 0.5 mg, Oral, Q6H PRN, Wynetta Fines T, MD   morphine (PF) 4 MG/ML injection 4 mg, 4 mg, Intravenous, Q4H PRN, Wynetta Fines T, MD, 4 mg at 09/16/22 2211   nitroGLYCERIN (NITROSTAT) SL tablet 0.4 mg, 0.4 mg, Sublingual, Q5 min PRN, Rancour, [name], MD, 0.4 mg at 09/16/22 1735   ondansetron (ZOFRAN) injection 4 mg, 4 mg, Intravenous, Q6H PRN, Wynetta Fines T, MD   senna-docusate (Senokot-S) tablet 1 tablet, 1 tablet, Oral, QHS PRN, Wynetta Fines T, MD   sodium chloride 0.9 % bolus 500 mL, 500 mL, Intravenous, Once, Wynetta Fines T, MD   sodium chloride flush (NS) 0.9 % injection 3 mL, 3 mL, Intravenous, Q12H, Yarel Rushlow J, MD, 3 mL at 09/18/22 0821   sodium chloride flush (NS) 0.9 % injection 3 mL, 3 mL, Intravenous, PRN, Nyaja [place] J,  MD   Objective:  Vital Signs in the last 24 hours: Temp:  [97.7 F (36.5 C)-98.7 F (37.1 C)] 97.7 F (36.5 C) (03/26 0620) Pulse Rate:  [0-81] 76 (03/26 0753) Resp:  [14-25] 14 (03/26 0753) BP: (99-138)/(51-78) 129/71 (03/26 0753) SpO2:  [92 %-99 %] 95 % (03/25 2001)  Intake/Output from previous day: 03/25 0701 - 03/26 0700 In: 537.4 [I.V.:537.4] Out: -   Physical Exam Vitals and nursing note reviewed.  Constitutional:      General: He is not in acute distress. Neck:     Vascular: No JVD.  Cardiovascular:     Rate and Rhythm: Normal rate and regular rhythm.     Heart sounds: Normal heart sounds. No murmur heard. Pulmonary:     Effort: Pulmonary effort is normal.     Breath sounds: Normal breath sounds. No wheezing or rales.  Musculoskeletal:     Right lower leg: No edema.     Left lower leg: No edema.      Imaging/tests reviewed and independently interpreted: CBC, BMP, trop HS  Cardiac Studies:  Telemetry 09/18/2022: No significant arrhythmia  EKG 09/17/2022: Sinus rhythm Subtle STE lead II, III, not STEMI  Coronary angiography 09/17/2022: Normal epicardial coronary arteries withotu significant epicardial disease.  Slow flow in all vessels Normal LVEDP Mildly elevated IMR, suggests possible microvascular dysfunction  Echocardiogram 09/17/2022:  1. Left ventricular ejection fraction, by estimation, is 60 to 65%. The  left ventricle has normal function. The left ventricle has no regional  wall motion abnormalities. Left ventricular diastolic parameters are  consistent with Grade II diastolic  dysfunction (pseudonormalization).   2. Right ventricular systolic function is normal. The right ventricular  size is normal. There is normal pulmonary artery systolic pressure.   3. Left atrial size was mild to moderately dilated.   4. Right atrial size was mildly dilated.   5. The mitral valve is normal in structure. Trivial mitral valve  regurgitation. No  evidence of mitral stenosis.   6. The aortic valve is normal in structure. There is mild calcification  of the aortic valve. Aortic valve regurgitation is trivial. Mild aortic  valve stenosis. Aortic valve mean gradient measures 6.0 mmHg. Aortic valve  Vmax measures 1.67 m/s.   7. The inferior vena cava is normal in size with greater than 50%  respiratory variability, suggesting right atrial pressure of 3 mmHg.     Assessment & Recommendations:   67 y.o. Caucasian male  with hypertension, hyperlipidemia, type 2 DM, h/o tongue cancer, now with chest pain   Chest pain: Pleuritic chest pain, radiating to both shoulders, now resolved. Nonspecific EKG abnormality. Stuttering trop HS 80-100. No epicardial coronary artery disease. Only mildly abnormal microvascular resistance.  He does not have any angina at baseline. If he was recurrent angina on exertion, could switch atenolol (on atenolol-HCTZ at home) to vasodilatory beta blocker like carvedilol or nebivolol. I do not think he needs DAPT. Also, with upcoming colonoscopy and prior polyps, could also hold Aspirin until after colonoscopy next week. Low cardiac risk for colonoscopy.  Outpatient f/u arranged on 4/8.    Discussed interpretation of tests and management recommendations with the primary team   [name], MD Pager: [phone] Office: [phone]

## 2022-09-19 ENCOUNTER — Telehealth: Payer: Self-pay

## 2022-09-19 LAB — LIPOPROTEIN A (LPA): Lipoprotein (a): 61.7 nmol/L — ABNORMAL HIGH (ref <75.0)

## 2022-09-19 NOTE — Telephone Encounter (Signed)
Location of hospitalization: [place] Reason for hospitalization: CP and SOB Date of discharge: 09/18/2022 Date of first communication with patient: today Person contacting patient: Me Current symptoms: None Do you understand why you were in the Hospital: Yes Questions regarding discharge instructions: None Where were you discharged to: Home Medications reviewed: Yes Allergies reviewed: Yes Dietary changes reviewed: Yes. Discussed low fat and low salt diet.  Referals reviewed: NA Activities of Daily Living: Able to with mild limitations Any transportation issues/concerns: None Any patient concerns: None Confirmed importance & date/time of Follow up appt: Yes Confirmed with patient if condition begins to worsen call. Pt was given the office number and encouraged to call back with questions or concerns: Yes

## 2022-09-27 ENCOUNTER — Ambulatory Visit: Payer: Medicare Other | Admitting: Cardiology

## 2022-09-30 NOTE — Progress Notes (Unsigned)
Patient referred by Blair Heys, MD for post hospital follow up NSTEMI  Subjective:   Jose Orr, male    DOB: 04/02/1956, 67 y.o.   MRN: [id number]  *** No chief complaint on file.   *** HPI  67 y.o.  male with a significant past medical history of hypertension, hyperlipidemia, type 2 diabetes, h/o tongue cancer, and recent NSTEMI s/p coronary angiography (09/17/2022) presents to office to establishing care, and post hospital follow up evaluation.   Patient was recently discharged from the hospital where he was admitted for NSTEMI. He had chest pain that was described as retorosternal chest pain radiating to both shoulders, worse with deep breathing, present at rest. Nitroglycerin was taken with initial relief of pain, however pain did not completely subside.  Intial EKG showed ST elevation in lead II, III, not meeting STEMI criteria. Troponins were mildly elevated  80-100. Given the patient's risk factors,  patient underwent coronary angiography which showed normal epicardial coronary arteries without significant epicardial disease with mild microvascular dysfunction. Patient symptoms resolved, and was discharged.   At baseline, patient has been riding a bike regularly without any complaints of chest pain or shortness of breath.  He does have family history of CAD, with his brother just having undergone CABG.  Today he   *** Past Medical History:  Diagnosis Date   Diabetes mellitus without complication (HCC)    Gout    last episode 2010, taking Allopurinol   Hyperlipidemia    Hypertension    Tongue cancer (HCC)    Wears glasses     *** Past Surgical History:  Procedure Laterality Date   CORONARY PRESSURE/FFR STUDY N/A 09/17/2022   Procedure: INTRAVASCULAR PRESSURE WIRE/FFR STUDY;  Surgeon: Elder Negus, MD;  Location: [facility];  Service: Cardiovascular;  Laterality: N/A;   EXCISION OF TONGUE LESION Left 10/27/2013   Procedure: LEFT PARTIAL  GLOSSECTOMY;  Surgeon: Darletta Moll, MD;  Location: [facility];  Service: ENT;  Laterality: Left;   EXCISION OF TONGUE LESION N/A 10/25/2015   Procedure: PARTIAL GLOSSECTOMY;  Surgeon: Newman Pies, MD;  Location: [facility];  Service: ENT;  Laterality: N/A;   KNEE ARTHROSCOPY Bilateral [id number]   multiple surgeries   LEFT HEART CATH AND CORONARY ANGIOGRAPHY N/A 09/17/2022   Procedure: LEFT HEART CATH AND CORONARY ANGIOGRAPHY;  Surgeon: Elder Negus, MD;  Location: [facility];  Service: Cardiovascular;  Laterality: N/A;   TONGUE BIOPSY     x3   TONSILLECTOMY      *** Social History   Tobacco Use  Smoking Status Never  Smokeless Tobacco Never    Social History   Substance and Sexual Activity  Alcohol Use Yes   Comment: rare    *** Family History  Problem Relation Age of Onset   Pneumonia Mother    Stroke Father     ***  Current Outpatient Medications:    acetaminophen (TYLENOL) 500 MG tablet, Take 1,000 mg by mouth every 4 (four) hours as needed for mild pain., Disp: , Rfl:    allopurinol (ZYLOPRIM) 300 MG tablet, Take 300 mg by mouth daily., Disp: , Rfl:    atenolol-chlorthalidone (TENORETIC) 50-25 MG per tablet, Take 1 tablet by mouth daily., Disp: , Rfl:    atorvastatin (LIPITOR) 40 MG tablet, Take 40 mg by mouth daily., Disp: , Rfl:    gabapentin (NEURONTIN) 300 MG capsule, Take 300 mg by mouth daily as needed (For knee pain)., Disp: ,  Rfl: 1   LORazepam (ATIVAN) 1 MG tablet, Take 1 mg by mouth every 6 (six) hours as needed for anxiety., Disp: , Rfl:    metFORMIN (GLUCOPHAGE-XR) 500 MG 24 hr tablet, Take 1,500 mg by mouth daily., Disp: , Rfl:    NAPROXEN PO, Take 1 tablet by mouth daily as needed (For pain)., Disp: , Rfl:    Cardiovascular and other pertinent studies:  Reviewed external labs and tests, independently interpreted  *** EKG ***/***/202***: ***  ***  *** Recent labs: 09/18/2022: Glucose 120, BUN/Cr 9/0.92.  EGFR >60. Na/K 135/3.6. Rest of the CMP normal H/H 12.2/36.3. MCV 91.0. Platelets 184 LP (a) - 61.7  *** ROS      *** There were no vitals filed for this visit.   There is no height or weight on file to calculate BMI. There were no vitals filed for this visit.  *** Objective:   Physical Exam    ***     Visit diagnoses:   ICD-10-CM   1. Precordial pain  R07.2        No orders of the defined types were placed in this encounter.    Medication changes this visit: There are no discontinued medications.  No orders of the defined types were placed in this encounter.    Assessment & Recommendations:   67 y.o.  male with a significant past medical history of hypertension, hyperlipidemia, type 2 diabetes, h/o tongue cancer, and recent NSTEMI s/p coronary angiography (09/17/2022) presents to office to establishing care, and post hospital follow up evaluation.   ***  Thank you for referring the patient to [place]. Please feel free to contact with any questions.   Elder Negus, MD Pager: [phone] Office: [phone]

## 2022-10-01 ENCOUNTER — Ambulatory Visit: Payer: Medicare Other | Admitting: Cardiology

## 2022-10-01 ENCOUNTER — Encounter: Payer: Self-pay | Admitting: Cardiology

## 2022-10-01 VITALS — BP 130/78 | HR 75 | Ht 72.0 in | Wt 263.0 lb

## 2022-10-01 DIAGNOSIS — R0781 Pleurodynia: Secondary | ICD-10-CM

## 2022-10-01 DIAGNOSIS — R072 Precordial pain: Secondary | ICD-10-CM

## 2022-10-01 DIAGNOSIS — R7989 Other specified abnormal findings of blood chemistry: Secondary | ICD-10-CM

## 2022-10-01 MED ORDER — ASPIRIN 81 MG PO TBEC: 81.0000 mg | DELAYED_RELEASE_TABLET | Freq: Every day | ORAL | 3 refills | Status: DC

## 2022-10-04 ENCOUNTER — Ambulatory Visit
Admission: RE | Admit: 2022-10-04 | Discharge: 2022-10-04 | Disposition: A | Discharge status: Home / Self Care | Payer: Medicare Other | Source: Ambulatory Visit | Attending: Cardiology | Admitting: Cardiology

## 2022-10-04 DIAGNOSIS — R0781 Pleurodynia: Secondary | ICD-10-CM

## 2022-10-04 DIAGNOSIS — R072 Precordial pain: Secondary | ICD-10-CM

## 2022-10-04 DIAGNOSIS — R7989 Other specified abnormal findings of blood chemistry: Secondary | ICD-10-CM

## 2022-10-04 MED ORDER — IOPAMIDOL (ISOVUE-370) INJECTION 76%
75.0000 mL | Freq: Once | INTRAVENOUS | Status: AC | PRN
  Administered 2022-10-04: 75 mL via INTRAVENOUS

## 2022-10-04 NOTE — Progress Notes (Signed)
Spoke with the patient. No PE. I reckon his pleuritic chest pain could be due to atelectasis. Recommend deep breathing. No cardiac MRI needed at this time.  Elder Negus, MD

## 2023-01-03 ENCOUNTER — Ambulatory Visit: Payer: Medicare Other | Admitting: Cardiology

## 2023-01-03 ENCOUNTER — Encounter: Payer: Self-pay | Admitting: Cardiology

## 2023-01-03 VITALS — BP 122/81 | HR 57 | Resp 16 | Ht 72.0 in | Wt 263.0 lb

## 2023-01-03 DIAGNOSIS — R072 Precordial pain: Secondary | ICD-10-CM

## 2023-01-03 DIAGNOSIS — I10 Essential (primary) hypertension: Secondary | ICD-10-CM

## 2023-01-03 NOTE — Progress Notes (Signed)
Follow up visit  Subjective:   Jose Orr, male    DOB: Nov 16, 1955, 67 y.o.   MRN: [id number]   HPI  Chief Complaint  Patient presents with   Hypertension   Follow-up    3 months    67 y.o.  Caucasian male  with hypertension, hyperlipidemia, type 2 DM, h/o tongue cancer, pleuritic chest pain, troponin elevation (08/2022).   Patient has been doing very well. He recently had a 3 day bike ride of 100 miles without any chest pain, dyspnea. Blood pressure slightly elevated on first check, but subsequently improved.  Coronary angiogram in 08/2022 showed no obstructive CAD. Reduced coronary flow was suggestive of endothelial dysfunction.    Current Outpatient Medications:    acetaminophen (TYLENOL) 500 MG tablet, Take 1,000 mg by mouth every 4 (four) hours as needed for mild pain., Disp: , Rfl:    allopurinol (ZYLOPRIM) 300 MG tablet, Take 300 mg by mouth daily., Disp: , Rfl:    aspirin EC 81 MG tablet, Take 1 tablet (81 mg total) by mouth daily. Swallow whole., Disp: 90 tablet, Rfl: 3   atenolol-chlorthalidone (TENORETIC) 50-25 MG per tablet, Take 1 tablet by mouth daily., Disp: , Rfl:    atorvastatin (LIPITOR) 40 MG tablet, Take 40 mg by mouth daily., Disp: , Rfl:    gabapentin (NEURONTIN) 300 MG capsule, Take 300 mg by mouth daily as needed (For knee pain)., Disp: , Rfl: 1   LORazepam (ATIVAN) 1 MG tablet, Take 1 mg by mouth every 6 (six) hours as needed for anxiety., Disp: , Rfl:    metFORMIN (GLUCOPHAGE-XR) 500 MG 24 hr tablet, Take 1,500 mg by mouth daily., Disp: , Rfl:    NAPROXEN PO, Take 1 tablet by mouth daily as needed (For pain)., Disp: , Rfl:    Cardiovascular & other pertient studies:  Reviewed external labs and tests, independently interpreted  EKG 10/01/2022: Sinus rhythm 75 bpm  Nonspecific T-abnormality  Coronary angiogram 09/17/2022: Normal epicardial coronary arteries withotu significant epicardial disease.  Slow flow in all vessels Normal LVEDP Mildly  elevated IMR, suggests possible microvascular dysfunction       Echocardiogram 09/17/2022: 1. Left ventricular ejection fraction, by estimation, is 60 to 65%. The  left ventricle has normal function. The left ventricle has no regional  wall motion abnormalities. Left ventricular diastolic parameters are  consistent with Grade II diastolic  dysfunction (pseudonormalization).   2. Right ventricular systolic function is normal. The right ventricular  size is normal. There is normal pulmonary artery systolic pressure.   3. Left atrial size was mild to moderately dilated.   4. Right atrial size was mildly dilated.   5. The mitral valve is normal in structure. Trivial mitral valve  regurgitation. No evidence of mitral stenosis.   6. The aortic valve is normal in structure. There is mild calcification  of the aortic valve. Aortic valve regurgitation is trivial. Mild aortic  valve stenosis. Aortic valve mean gradient measures 6.0 mmHg. Aortic valve  Vmax measures 1.67 m/s.   7. The inferior vena cava is normal in size with greater than 50%  respiratory variability, suggesting right atrial pressure of 3 mmHg.   CTA chest 10/04/2022: There is no evidence of pulmonary artery embolism. There is ectasia of main pulmonary artery suggesting possible pulmonary arterial hypertension. There is homogeneous enhancement in thoracic aorta.   Small linear patchy infiltrate is seen in posterior left lower lung field suggesting atelectasis/pneumonia. There is minimal left pleural effusion. Small pericardial effusion. Coronary  artery disease.   There is minimal decrease in height without break in the cortical margins in the upper endplates of bodies of T3 and T4 vertebrae, most likely old mild compression fractures. Small sclerotic density in the body of T5 vertebra may suggest benign bone island. If the patient has any known primary malignancy, follow-up radionuclide bone scan may be considered.     Recent labs: 09/18/2022: Glucose 120, BUN/Cr 9/0.82. EGFR >60. Na/K 135/3.6.  H/H 12/36. MCV 91. Platelets 184 HbA1C 6.7% Lipid panel NA TSH NA  Latest Reference Range & Units 09/16/22 13:50 09/16/22 15:47 09/16/22 16:36 09/16/22 22:02 09/17/22 04:55  Troponin I (High Sensitivity) <18 ng/L 80 (H) 70 (H) 99 (H) 89 (H) 79 (H)  (H): Data is abnormally high    Review of Systems  Cardiovascular:  Negative for chest pain, dyspnea on exertion, leg swelling, palpitations and syncope.         Vitals:   01/03/23 1002 01/03/23 1013  BP: (!) 162/84 (!) 150/76  Pulse: 62 (!) 58  Resp: 16   SpO2: 96%     Body mass index is 35.67 kg/m. Filed Weights   01/03/23 1002  Weight: 263 lb (119.3 kg)     Objective:   Physical Exam Vitals and nursing note reviewed.  Constitutional:      General: He is not in acute distress. Neck:     Vascular: No JVD.  Cardiovascular:     Rate and Rhythm: Normal rate and regular rhythm.     Heart sounds: Normal heart sounds. No murmur heard. Pulmonary:     Effort: Pulmonary effort is normal.     Breath sounds: Normal breath sounds. No wheezing or rales.  Musculoskeletal:     Right lower leg: No edema.     Left lower leg: No edema.             Visit diagnoses:   ICD-10-CM   1. Essential hypertension  I10     2. Precordial pain  R07.2         Medication changes this visit: Medications Discontinued During This Encounter  Medication Reason   aspirin EC 81 MG tablet Discontinued by provider      Assessment & Recommendations:   67 y.o.o.  Caucasian male  with hypertension, hyperlipidemia, type 2 DM, h/o tongue cancer, pleuritic chest pain, troponin elevation (08/2022).   Episode in 08/222 was not true ACS in absence of true rise and fall of troponin, no significant CAD, normal EF and wall motion. Endothelial dysfunction possible, but currently completely asymptomatic. Discontinued Aspirin. Continue hypertension, lipid management.  F/u labs with PCP.  F?u w/me in 1 year.       Elder Negus, MD Pager: [phone] Office: [phone]

## 2023-03-19 ENCOUNTER — Telehealth: Payer: Self-pay | Admitting: Cardiology

## 2023-03-19 ENCOUNTER — Other Ambulatory Visit: Payer: Self-pay | Admitting: Cardiology

## 2023-03-19 DIAGNOSIS — R002 Palpitations: Secondary | ICD-10-CM

## 2023-03-19 NOTE — Telephone Encounter (Signed)
Spoke with patient and he states his fit bit alerted him he had  an irregular notification. He states it read he had been going in and out of AFIB since last week 9/17 in the middle of the night for two days. Patient is asymptomatic and he feels fine. HR currently 67. Did advise to continue to monitor. He is no longer in AFIB. Will forward to provider. Discussed ED precautions.

## 2023-03-19 NOTE — Telephone Encounter (Signed)
Health alert from his fitbit, got irregular rhythm notification. Stated he was having a afib even from  9/17, Tuesday morning 4:35 and ended 9/18 Wednesday afternoon at 1:37. There was 31 alerts on his phone. Stated that he called 9/18 and was told that someone would called him but no one never did. Please advise

## 2023-03-19 NOTE — Telephone Encounter (Signed)
Recommend 2 week Zio patch. I will order. Can you follow up on arranging please? Thank you.  Elder Negus, MD

## 2023-03-20 ENCOUNTER — Ambulatory Visit: Payer: Medicare Other | Attending: Cardiology

## 2023-03-20 ENCOUNTER — Telehealth: Payer: Self-pay | Admitting: *Deleted

## 2023-03-20 DIAGNOSIS — R002 Palpitations: Secondary | ICD-10-CM

## 2023-03-20 NOTE — Telephone Encounter (Signed)
Patient was returning phone call about zio monitor. Patient has questions to ask as well. Please call back

## 2023-03-20 NOTE — Progress Notes (Unsigned)
Enrolled for [employer] to mail a ZIO XT long term holter monitor to the patients address on file.  

## 2023-03-20 NOTE — Telephone Encounter (Signed)
Thank you addressing his concerns, Shelly. My apologies to the patient. Look forward to the monitor results when in due time. Best wishes to his wife.   Thanks MJP

## 2023-03-20 NOTE — Telephone Encounter (Signed)
I did order one yesterday. Could you please check to make sure it went through correctly? It was the first one I ordered as a Psychologist, occupational so just want to make sure the workflow worked out correctly.  Thanks MJP

## 2023-03-20 NOTE — Telephone Encounter (Signed)
Patient had a few questions regarding his monitor which were answered. He was told not to shower for 24 hours after the application of his monitor.  He does long distance bike riding.  Advised him not to engage in activities which would cause excessive perspiration while wearing the ZIO patch. He did want to express his frustration that his concern about his A-fib notifications were not addressed  in a more timely manner, stating his initial call was back on 03/12/23. He did, however understand there may have been delays due to his physicians practice joining [facility]. Patient stated he was also stressed because his wife is having cardiac issues and will be having a cath and probable stent at [facility] tomorrow. He did forget to mention when he called about his Fit Bit A-Fib notification, he was diagnosed with Covid at the beginning of September and had Paxlovid.  He has not felt well since then.  Patient was told this information would be forwarded to his physician.

## 2023-03-23 DIAGNOSIS — R002 Palpitations: Secondary | ICD-10-CM | POA: Diagnosis not present

## 2023-04-15 NOTE — Progress Notes (Signed)
Episodes of rapid heart beat from top chamber of the heart, as well as skipped beat from bottom chamber of the heart noted. These episodes can improve with vagal maneuvers. If vagal maneuvers do not help, it is reasonable to use diltiazem 30 mg q8 hour as needed. If patient is agreeable, please send 90 pills. Last seen by me in 12/2022 and was going to be 1 year follow up. With the recent palpitations, I request 3 month f/u from today.   Thanks MJP

## 2023-04-16 ENCOUNTER — Telehealth: Payer: Self-pay | Admitting: *Deleted

## 2023-04-16 ENCOUNTER — Other Ambulatory Visit: Payer: Self-pay | Admitting: Cardiology

## 2023-04-16 MED ORDER — DILTIAZEM HCL 30 MG PO TABS: 30.0000 mg | ORAL_TABLET | Freq: Three times a day (TID) | ORAL | 1 refills | Status: DC | PRN

## 2023-04-16 NOTE — Telephone Encounter (Signed)
-----   Message from Nurse Corky Crafts sent at 04/16/2023  7:56 AM EDT -----  ----- Message ----- From: Elder Negus, MD Sent: 04/15/2023   8:14 PM EDT To: Loa Socks, LPN  Episodes of rapid heart beat from top chamber of the heart, as well as skipped beat from bottom chamber of the heart noted. These episodes can improve with vagal maneuvers. If vagal maneuvers do not help, it is reasonable to use diltiazem 30 mg q8 hour as needed. If patient is agreeable, please send 90 pills. Last seen by me in 12/2022 and was going to be 1 year follow up. With the recent palpitations, I request 3 month f/u from today.   Thanks MJP

## 2023-04-16 NOTE — Telephone Encounter (Signed)
The patient has been notified of the result and verbalized understanding.  All questions (if any) were answered.  Vagal maneuver education provided to the pt.  Pt aware I will send in diltiazem 30 mg po every 8 hours PRN for palpitations.  Confirmed the pharmacy of choice with the pt.   Scheduled the pt a 3 month follow-up appt with Tereso Newcomer PA-C on 07/19/23 at 0800.  Pt aware to arrive 15 mins prior to this appt.   Pt is aware of new office address/location.   Pt states he is feeling great and is asymptomatic with no issues with palpitations, but will have the PRN diltiazem on hand to take, if symptoms return.  He states he was under and still is under a lot of stress with his wife's own cardiac condition and she will be getting bypass in Nov.    He associates the issues with his rates that happened a couple weeks ago was from stress with dealing with her health issues and he was recovering from covid.   Pt verbalized understanding and agrees with this plan.

## 2023-07-18 DIAGNOSIS — E7800 Pure hypercholesterolemia, unspecified: Secondary | ICD-10-CM | POA: Insufficient documentation

## 2023-07-18 DIAGNOSIS — I2585 Chronic coronary microvascular dysfunction: Secondary | ICD-10-CM | POA: Insufficient documentation

## 2023-07-18 DIAGNOSIS — R002 Palpitations: Secondary | ICD-10-CM | POA: Insufficient documentation

## 2023-07-18 NOTE — Progress Notes (Signed)
Cardiology Office Note:    Date:  07/19/2023  ID:  Jose Orr, DOB [birth year], MRN [id number] PCP: Blair Heys, MD (Inactive)  [facility] Providers Cardiologist:  Elder Negus, MD       Patient Profile:      Coronary microvascular dysfunction CP, ? hsT 08/2022 >>LHC 09/17/2022: Normal coronary arteries, slow flow in all vessels, mildly elevated IMR suggesting possible microvascular dysfunction TTE 09/17/2022: EF 60-65, no RWMA, GR 2 DD, normal RVSF, normal PASP, mild-moderate LAE, mild RAE, trivial MR, trivial AI, mild AS (mean 6 mmHg, V-max 167 cm/s, DI 0.88), RAP 3 Supraventricular Tachycardia Monitor 10/24: Mobitz 1; 13 episodes SVT (longest 17 seconds), <1% PACs/PVCs; no A-fib or high-grade heart block Hypertension Hyperlipidemia Diabetes mellitus Hx tongue CA       History of Present Illness:  Discussed the use of AI scribe software for clinical note transcription with the patient, who gave verbal consent to proceed.  Jose Orr is a 68 y.o. male who returns for follow-up of palpitations.  He was last seen by Dr. Rosemary Holms July 2024.  Patient recently called in for palpitations and concerns for A-fib on his wearable device.  Cardiac monitor demonstrated 13 episodes of SVT with the longest being 17 seconds.  Symptoms were noted with PVCs and PACs.  Vagal maneuvers +/- diltiazem 30 mg as needed recommended along with earlier follow-up. He is here alone. The patient reports no recent palpitations or other cardiac symptoms. In September, the patient experienced an episode of rapid heart rate detected as atrial fibrillation (AFib) on his Fitbit, which was not correlated with any symptoms. The patient reported feeling normal even during this episode, which lasted a whole day and continued while he was asleep. However, the heart monitor did not detect any AFib during the monitoring period. The patient has a history of active lifestyle, including regular biking, until  August when he tested positive for COVID-19. Post-COVID, the patient experienced heart palpitations after completing a course of Paxlovid. Since then, the patient has not been able to resume biking due to caregiving responsibilities for his spouse who just had bypass surgery. The patient plans to resume biking once the weather permits and is looking forward to a bike tour in late April.     ROS-See HPI    Studies Reviewed:       Labs reviewed-Care Everywhere 04/22/2023: Total cholesterol 137, HDL 50, LDL 70, triglycerides 94, W0J 6.5, ALT 29  Labs reviewed-Chart Review 09/18/2022: K+ 3.6, creatinine 0.82, Hgb 12.2      Risk Assessment/Calculations:           Physical Exam:   VS:  BP 139/80   Pulse 74   Ht 6' (1.829 m)   Wt 263 lb 12.8 oz (119.7 kg)   SpO2 96%   BMI 35.78 kg/m    Wt Readings from Last 3 Encounters:  07/19/23 263 lb 12.8 oz (119.7 kg)  01/03/23 263 lb (119.3 kg)  10/01/22 263 lb (119.3 kg)    Constitutional:      Appearance: Healthy appearance. Not in distress.  Neck:     Thyroid: Thyroid normal.  Pulmonary:     Breath sounds: Normal breath sounds. No wheezing. No rales.  Cardiovascular:     Normal rate. Regular rhythm. Normal S1.     Murmurs: There is no murmur.  Edema:    Peripheral edema absent.         Assessment and Plan:   Assessment & Plan Palpitations Patient  recently had ZIO XT monitor placed for elevated HR on his fitness watch. This demonstrated several episodes of SVT with longest being 17 seconds.  There was no atrial fibrillation or high-grade heart block.  Symptoms were reported with PVCs and PACs which were less than 1% burden. He has not had any further alerts from his watch and has not had palpitations. We discussed vagal maneuvers, when to use the diltiazem and when to go to the ED. -Continue diltiazem 30 mg every 8 hours as needed Chronic coronary microvascular dysfunction History of admission for chest pain and elevated troponins in  March 2024.  Cardiac catheterization demonstrated normal coronary arteries and findings suggestive of possible microvascular dysfunction.He is not having chest pain to suggest angina. He has been an avid bike rider and is going to resume this soon. Primary hypertension Borderline control. He has not been as active and has gained some weight. He plans to get back to riding his bike soon.  -Continue atenolol/chlorthalidone 50/25 mg daily Pure hypercholesterolemia LDL at goal. -Continue atorvastatin 40 mg daily       Dispo:  Return in about 6 months (around 01/16/2024) for Routine Follow Up, w/ Dr. Rosemary Holms.  Signed, Tereso Newcomer, PA-C

## 2023-07-19 ENCOUNTER — Encounter: Payer: Self-pay | Admitting: Physician Assistant

## 2023-07-19 ENCOUNTER — Ambulatory Visit: Payer: Medicare Other | Attending: Physician Assistant | Admitting: Physician Assistant

## 2023-07-19 VITALS — BP 139/80 | HR 74 | Ht 72.0 in | Wt 263.8 lb

## 2023-07-19 DIAGNOSIS — R002 Palpitations: Secondary | ICD-10-CM | POA: Insufficient documentation

## 2023-07-19 DIAGNOSIS — I2585 Chronic coronary microvascular dysfunction: Secondary | ICD-10-CM | POA: Diagnosis present

## 2023-07-19 DIAGNOSIS — E7800 Pure hypercholesterolemia, unspecified: Secondary | ICD-10-CM | POA: Diagnosis present

## 2023-07-19 DIAGNOSIS — I10 Essential (primary) hypertension: Secondary | ICD-10-CM | POA: Insufficient documentation

## 2023-07-19 NOTE — Assessment & Plan Note (Signed)
History of admission for chest pain and elevated troponins in March 2024.  Cardiac catheterization demonstrated normal coronary arteries and findings suggestive of possible microvascular dysfunction.He is not having chest pain to suggest angina. He has been an avid bike rider and is going to resume this soon.

## 2023-07-19 NOTE — Assessment & Plan Note (Signed)
LDL at goal. -Continue atorvastatin 40 mg daily

## 2023-07-19 NOTE — Patient Instructions (Signed)
Medication Instructions:  Your physician recommends that you continue on your current medications as directed. Please refer to the Current Medication list given to you today.  *If you need a refill on your cardiac medications before your next appointment, please call your pharmacy*   Lab Work: None ordered  If you have labs (blood work) drawn today and your tests are completely normal, you will receive your results only by: MyChart Message (if you have MyChart) OR A paper copy in the mail If you have any lab test that is abnormal or we need to change your treatment, we will call you to review the results.   Testing/Procedures: None ordered   Follow-Up: At [facility], you and your health needs are our priority.  As part of our continuing mission to provide you with exceptional heart care, we have created designated Provider Care Teams.  These Care Teams include your primary Cardiologist (physician) and Advanced Practice Providers (APPs -  Physician Assistants and Nurse Practitioners) who all work together to provide you with the care you need, when you need it.  We recommend signing up for the patient portal called "MyChart".  Sign up information is provided on this After Visit Summary.  MyChart is used to connect with patients for Virtual Visits (Telemedicine).  Patients are able to view lab/test results, encounter notes, upcoming appointments, etc.  Non-urgent messages can be sent to your provider as well.   To learn more about what you can do with MyChart, go to [email or web address].    Your next appointment:   6 month(s)  Provider:   Elder Negus, MD     Other Instructions           1st Floor: - Lobby - Registration  - Pharmacy  - Lab - Cafe  2nd Floor: - PV Lab - Diagnostic Testing (echo, CT, nuclear med)  3rd Floor: - Vacant  4th Floor: - TCTS (cardiothoracic surgery) - AFib Clinic - Structural Heart Clinic - Vascular Surgery  -  Vascular Ultrasound  5th Floor: - HeartCare Cardiology (general and EP) - Clinical Pharmacy for coumadin, hypertension, lipid, weight-loss medications, and med management appointments    Valet parking services will be available as well.

## 2023-07-19 NOTE — Assessment & Plan Note (Signed)
Patient recently had ZIO XT monitor placed for elevated HR on his fitness watch. This demonstrated several episodes of SVT with longest being 17 seconds.  There was no atrial fibrillation or high-grade heart block.  Symptoms were reported with PVCs and PACs which were less than 1% burden. He has not had any further alerts from his watch and has not had palpitations. We discussed vagal maneuvers, when to use the diltiazem and when to go to the ED. -Continue diltiazem 30 mg every 8 hours as needed

## 2023-07-19 NOTE — Assessment & Plan Note (Signed)
Borderline control. He has not been as active and has gained some weight. He plans to get back to riding his bike soon.  -Continue atenolol/chlorthalidone 50/25 mg daily

## 2024-01-03 ENCOUNTER — Ambulatory Visit: Payer: Self-pay | Admitting: Cardiology

## 2024-01-13 ENCOUNTER — Telehealth: Payer: Self-pay | Admitting: Cardiology

## 2024-01-13 ENCOUNTER — Ambulatory Visit: Attending: Cardiology

## 2024-01-13 DIAGNOSIS — R002 Palpitations: Secondary | ICD-10-CM

## 2024-01-13 DIAGNOSIS — I499 Cardiac arrhythmia, unspecified: Secondary | ICD-10-CM

## 2024-01-13 NOTE — Telephone Encounter (Signed)
 Lets do 1 week Zio patch. We should have results by the time his appt on 8/8.  Thanks MJP

## 2024-01-13 NOTE — Progress Notes (Unsigned)
 Enrolled for [employer] to mail a ZIO XT long term holter monitor to the patients address on file.

## 2024-01-13 NOTE — Telephone Encounter (Signed)
 Patient notified. He has worn a zio patch in the past.

## 2024-01-13 NOTE — Telephone Encounter (Signed)
 Patient c/o Palpitations:  STAT if patient reporting lightheadedness, shortness of breath, or chest pain  How long have you had palpitations/irregular HR/ Afib? Are you having the symptoms now?Per fit bit 127/65  Afib since Sunday, yes  Are you currently experiencing lightheadedness, SOB or CP? no  Do you have a history of afib (atrial fibrillation) or irregular heart rhythm? Yes afib  Have you checked your BP or HR? (document readings if available): 126/65 75  Are you experiencing any other symptoms? no

## 2024-01-13 NOTE — Telephone Encounter (Signed)
 I spoke with patient.  He reports his fitbit has been showing afib off and on since this past Friday.  Last noted afib was from 8:15-8:32 this AM.  Currently showing regular rhythm - heart rate is 75.  Sounds like his device shows heart rate but does not show actual EKG reading.  He started taking prn cardizem  over the weekend.  He does not feel any palpitations.   Will forward to Dr Elmira for review/recommendations.

## 2024-01-31 ENCOUNTER — Ambulatory Visit: Attending: Cardiology | Admitting: Cardiology

## 2024-01-31 ENCOUNTER — Encounter: Payer: Self-pay | Admitting: Cardiology

## 2024-01-31 VITALS — BP 130/62 | HR 64 | Ht 72.0 in | Wt 266.2 lb

## 2024-01-31 DIAGNOSIS — I10 Essential (primary) hypertension: Secondary | ICD-10-CM | POA: Insufficient documentation

## 2024-01-31 DIAGNOSIS — R002 Palpitations: Secondary | ICD-10-CM | POA: Diagnosis present

## 2024-01-31 NOTE — Progress Notes (Signed)
 Cardiology Office Note:  .   Date:  01/31/2024  ID:  Jose Orr, DOB July 02, 1955, MRN [id number] PCP: Hugh Lamar, MD (Inactive)  [facility] Providers Cardiologist:  Newman Lawrence, MD PCP: Hugh Lamar, MD (Inactive)  Chief Complaint  Patient presents with   Palpitations     Jose Orr is a 68 y.o. male with hypertension, hyperlipidemia, type 2 DM, h/o tongue cancer, pleuritic chest pain, troponin elevation (08/2022).   History of Present Illness  Patient has recently had episodes of palpitation lasting for several minutes.  They have occurred both during daytime as well as at nighttime.  At least 1 of these episodes occurred while patient was wearing a monitor that was recommended recently.  I do not have the monitor results available to me at.   Vitals:   01/31/24 0816  BP: 130/62  Pulse: 64  SpO2: 95%      Review of Systems  Cardiovascular:  Positive for palpitations. Negative for chest pain, dyspnea on exertion, leg swelling and syncope.        Studies Reviewed: SABRA        Labs 09/2023: Chol 150, TG 118, HDL 53, LDL 75 HbA1C 6.5% Hb 14 Cr 0.8 TSH 2.5  EKG 01/31/2024: Sinus rhythm with Premature atrial complexes When compared with ECG of 17-Sep-2022 16:02, Premature atrial complexes are now Present ST no longer elevated in Lateral leads   Zio patch monitor 13 days 03/23/2023 - 04/06/2023: Dominant rhythm: Sinus. HR 43-91 bpm. Avg HR 61 bpm, in sinus rhythm. Second Degree AV Block-Mobitz I (Wenckebach) was present.  13 episodes of SVT, fastest at 154 bpm for 15.1 secs, longest for 17 secs at 125 bpm. <1% isolated SVE, couplet/triplets. 0 episodes of VT. <1% isolated VE, couplet/triplets. No atrial fibrillation/atrial flutter/VT/high grade AV block, sinus pause >3sec noted. 12 patient triggered events, correlated with VE.    Coronary angiogram 08/2022: Normal epicardial coronary arteries withotu significant epicardial disease.   Slow flow in all vessels Normal LVEDP Mildly elevated IMR, suggests possible microvascular dysfunction   Echocardiogram 08/2022:  1. Left ventricular ejection fraction, by estimation, is 60 to 65%. The  left ventricle has normal function. The left ventricle has no regional  wall motion abnormalities. Left ventricular diastolic parameters are  consistent with Grade II diastolic dysfunction (pseudonormalization).   2. Right ventricular systolic function is normal. The right ventricular  size is normal. There is normal pulmonary artery systolic pressure.   3. Left atrial size was mild to moderately dilated.   4. Right atrial size was mildly dilated.   5. The mitral valve is normal in structure. Trivial mitral valve  regurgitation. No evidence of mitral stenosis.   6. The aortic valve is normal in structure. There is mild calcification  of the aortic valve. Aortic valve regurgitation is trivial. Mild aortic  valve stenosis. Aortic valve mean gradient measures 6.0 mmHg. Aortic valve  Vmax measures 1.67 m/s.   7. The inferior vena cava is normal in size with greater than 50%  respiratory variability, suggesting right atrial pressure of 3 mmHg.    Physical Exam Vitals and nursing note reviewed.  Constitutional:      General: He is not in acute distress. Neck:     Vascular: No JVD.  Cardiovascular:     Rate and Rhythm: Normal rate and regular rhythm.     Heart sounds: Normal heart sounds. No murmur heard. Pulmonary:     Effort: Pulmonary effort is normal.     Breath  sounds: Normal breath sounds. No wheezing or rales.  Musculoskeletal:     Right lower leg: No edema.     Left lower leg: No edema.      VISIT DIAGNOSES:   ICD-10-CM   1. Palpitations  R00.2 EKG 12-Lead    2. Essential hypertension  I10 EKG 12-Lead       Marilyn Wing is a 68 y.o. male with hypertension, hyperlipidemia, type 2 DM, h/o tongue cancer Assessment & Plan  Palpitations: Prior episodes of SVT.   Recent palpitation symptoms could either be SVT or atrial fibrillation.  Awaiting monitor results. Continue atenolol-hydrochlorothiazide for now.  Generally, blood pressure is low normal.  Therefore, not adding daily diltiazem .  Okay to use diltiazem  30 mg as needed for episodes of palpitations. Should he have A-fib, I would then recommend sleep study.  Hypertension: Well-controlled.  Mixed hyperlipidemia: LDL 75, HDL 63 on Lipitor 40 mg daily, continue the same.      F/u in 6 months  Signed, Newman JINNY Lawrence, MD

## 2024-01-31 NOTE — Patient Instructions (Signed)

## 2024-02-14 ENCOUNTER — Ambulatory Visit: Payer: Self-pay | Admitting: Cardiology

## 2024-02-14 DIAGNOSIS — R002 Palpitations: Secondary | ICD-10-CM

## 2024-02-14 DIAGNOSIS — I499 Cardiac arrhythmia, unspecified: Secondary | ICD-10-CM

## 2024-02-14 DIAGNOSIS — I480 Paroxysmal atrial fibrillation: Secondary | ICD-10-CM

## 2024-02-14 DIAGNOSIS — R0683 Snoring: Secondary | ICD-10-CM

## 2024-02-14 MED ORDER — APIXABAN 5 MG PO TABS: 5.0000 mg | ORAL_TABLET | Freq: Two times a day (BID) | ORAL | 3 refills | Status: DC

## 2024-02-14 NOTE — Progress Notes (Signed)
 New diagnosis of atrial fibrillation with 6% A-fib burden. Continue atenolol and hydrochlorothiazide. CHA2DS2-VASc: 3, annual stroke risk 3.2%. Recommend anticoagulation to reduce stroke risk with Eliquis  5 mg twice daily. If symptoms are severe, recommend referral to EP for consideration of ablation.  Thanks MJP

## 2024-02-14 NOTE — Progress Notes (Signed)
 Also recommend sleep study.  Thanks MJP

## 2024-02-17 ENCOUNTER — Other Ambulatory Visit (HOSPITAL_COMMUNITY): Payer: Self-pay

## 2024-02-17 MED ORDER — APIXABAN 5 MG PO TABS
5.0000 mg | ORAL_TABLET | Freq: Two times a day (BID) | ORAL | 3 refills | Status: DC
  Filled 2024-02-17 (×2): qty 60, 30d supply, fill #0
  Filled 2024-03-20: qty 60, 30d supply, fill #1
  Filled 2024-04-21: qty 60, 30d supply, fill #2
  Filled 2024-05-15: qty 60, 30d supply, fill #3

## 2024-02-17 NOTE — Telephone Encounter (Signed)
 Pt is following up and would like to know if there is some type of coupon for the Eliquis . Pt has not picked up medication from the pharmacy yet.

## 2024-03-19 NOTE — Progress Notes (Unsigned)
  Electrophysiology Office Note:    Date:  03/20/2024   ID:  Jose Orr, DOB 1955-11-30, MRN [id number]  [facility] Cardiologist:  Newman JINNY Lawrence, MD  [facility] Electrophysiologist:  OLE ONEIDA HOLTS, MD   Referring MD: Lawrence Newman JINNY, MD   Chief Complaint: Atrial fibrillation  History of Present Illness:    Jose Orr is a 68 year old man who I am seeing today for an evaluation of atrial fibrillation at the request of Dr. Lawrence.  The patient has a history of hypertension, hyperlipidemia, diabetes, tongue cancer.  At the last appointment with Dr. Lawrence on January 31, 2024, the patient reported episodes of palpitations.  After a recent heart monitor showed atrial fibrillation, the patient was referred to EP to consider ablation and watchman.  He is with family in clinic.  He is active.  He rides an e-bike.  He cannot tell when he is in atrial fibrillation.  The episodes occur at night based on his Fitbit recordings.  He has had no bleeding complications while on Eliquis .  He is concerned about the risk of bleeding during his bike rides.  He has not had a fall on his bike ride.  Family reports loud snoring and apneic episodes at night.    Their past medical, social and family history was reviewed.   ROS:   Please see the history of present illness.    All other systems reviewed and are negative.  EKGs/Labs/Other Studies Reviewed:    The following studies were reviewed today:  February 14, 2024 ZIO monitor 6% burden of atrial fibrillation corresponding to symptom triggered  September 17, 2022 echo EF 60% RV normal        Physical Exam:    VS:  BP (!) 152/80   Pulse 69   Ht 6' (1.829 m)   Wt 266 lb (120.7 kg)   SpO2 94%   BMI 36.08 kg/m     Wt Readings from Last 3 Encounters:  03/20/24 266 lb (120.7 kg)  01/31/24 266 lb 3.2 oz (120.7 kg)  07/19/23 263 lb 12.8 oz (119.7 kg)     GEN: no distress.  Obese CARD: RRR, No MRG RESP: No IWOB.  CTAB.        ASSESSMENT AND PLAN:    No diagnosis found.  #Atrial fibrillation Paroxysmal.  I suspect there is a link between his atrial fibrillation and untreated sleep apnea. Prescribed Eliquis  for stroke prophylaxis Recommend sleep study.  #Hypertension Above goal today.  Recommend checking blood pressures 1-2 times per week at home and recording the values.  Recommend bringing these recordings to the primary care physician.  Follow-up 8 weeks with APP.     Signed, OLE ONEIDA. HOLTS, MD, [facility], [facility] 03/20/2024 2:14 PM    Electrophysiology [facility]

## 2024-03-20 ENCOUNTER — Other Ambulatory Visit (HOSPITAL_COMMUNITY): Payer: Self-pay

## 2024-03-20 ENCOUNTER — Ambulatory Visit: Attending: Cardiology | Admitting: Cardiology

## 2024-03-20 ENCOUNTER — Encounter: Payer: Self-pay | Admitting: Cardiology

## 2024-03-20 VITALS — BP 152/80 | HR 69 | Ht 72.0 in | Wt 266.0 lb

## 2024-03-20 DIAGNOSIS — I480 Paroxysmal atrial fibrillation: Secondary | ICD-10-CM | POA: Insufficient documentation

## 2024-03-20 NOTE — Patient Instructions (Signed)
 Medication Instructions:  Your physician recommends that you continue on your current medications as directed. Please refer to the Current Medication list given to you today.  *If you need a refill on your cardiac medications before your next appointment, please call your pharmacy*  Testing/Procedures: Home Sleep Study Your physician has recommended that you have a sleep study. This test records several body functions during sleep, including: brain activity, eye movement, oxygen and carbon dioxide blood levels, heart rate and rhythm, breathing rate and rhythm, the flow of air through your mouth and nose, snoring, body muscle movements, and chest and belly movement.   Follow-Up: At [facility], you and your health needs are our priority.  As part of our continuing mission to provide you with exceptional heart care, our providers are all part of one team.  This team includes your primary Cardiologist (physician) and Advanced Practice Providers or APPs (Physician Assistants and Nurse Practitioners) who all work together to provide you with the care you need, when you need it.  Your next appointment:   8 weeks  Provider:   Ole Holts, MD

## 2024-04-01 ENCOUNTER — Telehealth: Payer: Self-pay | Admitting: Cardiology

## 2024-04-01 NOTE — Telephone Encounter (Signed)
  The patient is calling to follow up on his sleep study. He stated that he has not received any updates since August.

## 2024-04-01 NOTE — Telephone Encounter (Signed)
**Note De-Identified Jamison Soward Obfuscation** I advised the pt that the [employer] will be doing the PA and providing him with a WatchPAT One-HST device Brentin Shin the mail. He is aware that the [employer] will be contacting him after 10/21 and will stay in touch with him until he completes his WatchPAT One-HST.  He verbalized understanding to all information given and thanked me for calling him back.

## 2024-04-21 ENCOUNTER — Other Ambulatory Visit (HOSPITAL_COMMUNITY): Payer: Self-pay

## 2024-04-25 ENCOUNTER — Encounter: Payer: Self-pay | Admitting: Cardiology

## 2024-04-25 DIAGNOSIS — G4733 Obstructive sleep apnea (adult) (pediatric): Secondary | ICD-10-CM

## 2024-04-26 NOTE — Procedures (Signed)
   Sleep Study Report Patient Name: Jose Orr  ID: [id number] Birth Date: 01-26-1956 Age: 68  Gender: Male Study Date:04/25/2024 Referring Physician: Newman Lawrence, MD   TEST DESCRIPTION: Home sleep apnea testing was completed using the WatchPat, a Type 1 device, utilizing peripheral arterial tonometry (PAT), chest movement, actigraphy, pulse oximetry, pulse rate, body position and snore. AHI was calculated with apnea and hypopnea using valid sleep time as the denominator. RDI includes apneas, hypopneas, and RERAs. The data acquired and the scoring of sleep and all associated events were performed in accordance with the recommended standards and specifications as outlined in the AASM Manual for the Scoring of Sleep and Associated Events 2.2.0 (2015).  FINDINGS:  1. Moderate Obstructive Sleep Apnea with AHI 28.5/hr.  2. No Central Sleep Apnea with pAHIc 2.6/hr.  3. Oxygen desaturations as low as 76%.  4. Mild to moderate snoring was present. O2 sats were < 88% for 22.7 min.  5. Total sleep time was 7 hrs and 13 min.  6. 22.6% of total sleep time was spent in REM sleep.  7. Normal sleep onset latency at 16 min  8. Shortened REM sleep onset latency at 42 min.  9. Total awakenings were 6. 10. Arrhythmia detection: None   DIAGNOSIS: Moderate Obstructive Sleep Apnea (G47.33) Nocturnal Hypoxemia  Recommendations 1. Clinical correlation of these findings is necessary. The decision to treat obstructive sleep apnea (OSA) is usually based on the presence of apnea symptoms or the presence of associated medical conditions such as Hypertension, Congestive Heart Failure, Atrial Fibrillation or Obesity. The most common symptoms of OSA are snoring, gasping for breath while sleeping, daytime sleepiness and fatigue. 2. Initiating apnea therapy is recommended given the presence of symptoms and/or associated conditions. Recommend proceeding with one of the following:  a. Auto-CPAP therapy  with a pressure range of 5-20cm H2O.  b. An oral appliance (OA) that can be obtained from certain dentists with expertise in sleep medicine. These are primarily of use in non-obese patients with mild and moderate disease.  c. An ENT consultation which may be useful to look for specific causes of obstruction and possible treatment options.  d. If patient is intolerant to PAP therapy, consider referral to ENT for evaluation for hypoglossal nerve stimulator. 3. Close follow-up is necessary to ensure success with CPAP or oral appliance therapy for maximum benefit . 4. A follow-up oximetry study on CPAP is recommended to assess the adequacy of therapy and determine the need for supplemental oxygen or the potential need for Bi-level therapy. An arterial blood gas to determine the adequacy of baseline ventilation and oxygenation should also be considered. 5. Healthy sleep recommendations include: adequate nightly sleep (normal 7-9 hrs/night), avoidance of caffeine after noon and alcohol near bedtime, and maintaining a sleep environment that is cool, dark and quiet. 6. Weight loss for overweight patients is recommended. Even modest amounts of weight loss can significantly improve the severity of sleep apnea. 7. Snoring recommendations include: weight loss where appropriate, side sleeping, and avoidance of alcohol before bed. 8. Operation of motor vehicle should not be performed when sleepy.  Report prepared by: Signature: Electronically Signed: Wilbert Bihari 04/26/2024 7:33:30 PM

## 2024-04-27 ENCOUNTER — Ambulatory Visit: Attending: Cardiology

## 2024-04-27 DIAGNOSIS — R0683 Snoring: Secondary | ICD-10-CM

## 2024-05-14 NOTE — Progress Notes (Addendum)
 Electrophysiology Office Note:   Date:  05/15/2024  ID:  [name], DOB 06/15/1956, MRN [id number]  Primary Cardiologist: Newman JINNY Lawrence, MD Primary Heart Failure: None Electrophysiologist: OLE ONEIDA HOLTS, MD      History of Present Illness:   Jose Orr is a 68 y.o. male with h/o AF, HTN, HLD, DM, moderate OSA, tongue cancer seen today for routine electrophysiology followup.   Since last being seen in our clinic the patient reports he has felt fatigued over the last month and particularly not good in the last week.  He reports he has been under stress, traveled a bit to celebrate his 30th wedding anniversary and over did it.  He helped a friend prepare for a big BBQ this past weekend in [place] and has been really tired since then and his Fit Bit notified him of AF.  He had some chest burning / pressure but no pain. He took several doses of his PRN cardizem . No palpitations / awareness of irregular rhythm. He states he has fairly significant claustrophobia and anxiety and he worries about being able to tolerate his CPAP mask (newly diagnosed OSA).   He denies chest pain, palpitations, dyspnea, PND, orthopnea, nausea, vomiting, dizziness, syncope, edema, weight gain, or early satiety.   Review of systems complete and found to be negative unless listed in HPI.   EP Information / Studies Reviewed:    EKG is ordered today. Personal review as below.  EKG Interpretation Date/Time:  Friday May 15 2024 09:19:22 EST Ventricular Rate:  97 PR Interval:    QRS Duration:  100 QT Interval:  388 QTC Calculation: 492 R Axis:   74  Text Interpretation: Atrial flutter with 3:1 A-V conduction Nonspecific ST abnormality Confirmed by Aniceto Jarvis ([id number]) on 05/15/2024 9:38:11 AM    Arrhythmia / AAD / Pertinent EP Studies AF     Risk Assessment/Calculations:    CHA2DS2-VASc Score = 3   This indicates a 3.2% annual risk of stroke. The patient's score is based  upon: CHF History: 0 HTN History: 1 Diabetes History: 1 Stroke History: 0 Vascular Disease History: 0 Age Score: 1 Gender Score: 0             Physical Exam:   VS:  BP 128/83 (BP Location: Right Arm, Patient Position: Sitting, Cuff Size: Large)   Pulse 97   Ht 6' (1.829 m)   Wt 263 lb 12.8 oz (119.7 kg)   SpO2 97%   BMI 35.78 kg/m    Wt Readings from Last 3 Encounters:  05/15/24 263 lb 12.8 oz (119.7 kg)  03/20/24 266 lb (120.7 kg)  01/31/24 266 lb 3.2 oz (120.7 kg)     GEN: Well nourished, well developed in no acute distress NECK: No JVD; No carotid bruits CARDIAC: Regular rate and rhythm, no murmurs, rubs, gallops RESPIRATORY:  Clear to auscultation without rales, wheezing or rhonchi  ABDOMEN: Soft, non-tender, non-distended EXTREMITIES:  No edema; No deformity   ASSESSMENT AND PLAN:    Paroxysmal Atrial Fibrillation  AFL  CHA2DS2-VASc 3, no CAD on LHC 2024 -OAC for stroke prophylaxis  -sleep study as below  -begin flecainide  50 mg BID  -begin Cardizem  CD 120 mg daily  -plan for DCCV in 2 weeks  -continue PRN cardizem  30 mg Q8 for palpitations  -plan for EKG in one week post starting flecainide   -discussed plan to use medications to bridge him to ablation > will have him follow up with Dr. Kennyth in one month post  cardioversion to review for possible ETT (if needed) and ablation / watchman  -he is interested in LAAO as he likes to ride bikes and is concerned about injury / bleeding risk   Informed Consent   Shared Decision Making/Informed Consent The risks (stroke, cardiac arrhythmias rarely resulting in the need for a temporary or permanent pacemaker, skin irritation or burns and complications associated with conscious sedation including aspiration, arrhythmia, respiratory failure and death), benefits (restoration of normal sinus rhythm) and alternatives of a direct current cardioversion were explained in detail to Mr. Arriaga and he agrees to proceed.       Secondary Hypercoagulable State  -continue Eliquis  5mg  BID, dose reviewed and appropriate by age / wt   Hypertension  -well controlled on current regimen     Suspected OSA  -sleep study > moderate OSA with AHI 28.5 /hr, no central sleep apnea -check to see if pt got machine at follow up > study just read a few days ago  -he is uncertain he will be able to wear the mask due to claustrophobia / anxiety    Follow up with EP APP in 7-10 days for EKG on flecainide , 1 month post DCCV with Dr. Kennyth   Reviewed transition to Dr. Kennyth from Dr. Cindie with patient.   Signed, [name], NP-C, AGACNP-BC [facility] - Electrophysiology  05/15/2024, 1:30 PM

## 2024-05-15 ENCOUNTER — Encounter: Payer: Self-pay | Admitting: Pulmonary Disease

## 2024-05-15 ENCOUNTER — Ambulatory Visit: Attending: Pulmonary Disease | Admitting: Pulmonary Disease

## 2024-05-15 ENCOUNTER — Other Ambulatory Visit (HOSPITAL_COMMUNITY): Payer: Self-pay

## 2024-05-15 VITALS — BP 128/83 | HR 97 | Ht 72.0 in | Wt 263.8 lb

## 2024-05-15 DIAGNOSIS — I10 Essential (primary) hypertension: Secondary | ICD-10-CM | POA: Insufficient documentation

## 2024-05-15 DIAGNOSIS — D6869 Other thrombophilia: Secondary | ICD-10-CM | POA: Diagnosis present

## 2024-05-15 DIAGNOSIS — I480 Paroxysmal atrial fibrillation: Secondary | ICD-10-CM | POA: Diagnosis present

## 2024-05-15 MED ORDER — DILTIAZEM HCL ER COATED BEADS 120 MG PO CP24: 120.0000 mg | ORAL_CAPSULE | Freq: Every day | ORAL | 3 refills | Status: DC

## 2024-05-15 MED ORDER — FLECAINIDE ACETATE 50 MG PO TABS: 50.0000 mg | ORAL_TABLET | Freq: Two times a day (BID) | ORAL | 3 refills | Status: DC

## 2024-05-15 NOTE — Patient Instructions (Addendum)
 Medication Instructions:  START Flecainide  50mg  (1 tablet) in the AM and the PM  START Diltiazem  120mg  by mouth daily   *If you need a refill on your cardiac medications before your next appointment, please call your pharmacy*  Lab Work: CBC, BMET today If you have labs (blood work) drawn today and your tests are completely normal, you will receive your results only by: MyChart Message (if you have MyChart) OR A paper copy in the mail If you have any lab test that is abnormal or we need to change your treatment, we will call you to review the results.  Testing/Procedures: CARDIOVERSION scheduled for December 5th, 2025  Follow-Up: At [facility], you and your health needs are our priority.  As part of our continuing mission to provide you with exceptional heart care, our providers are all part of one team.  This team includes your primary Cardiologist (physician) and Advanced Practice Providers or APPs (Physician Assistants and Nurse Practitioners) who all work together to provide you with the care you need, when you need it.  Your next appointment:    Our schedulers will be in touch with you regarding an appt for an EKG in 7-10 days    Provider:  Dr. Fonda Kitty  (1 month after Cardioversion)

## 2024-05-16 LAB — BASIC METABOLIC PANEL WITH GFR
BUN/Creatinine Ratio: 14 (ref 10–24)
BUN: 15 mg/dL (ref 8–27)
CO2: 23 mmol/L (ref 20–29)
Calcium: 10.2 mg/dL (ref 8.6–10.2)
Chloride: 98 mmol/L (ref 96–106)
Creatinine, Ser: 1.05 mg/dL (ref 0.76–1.27)
Glucose: 126 mg/dL — ABNORMAL HIGH (ref 70–99)
Potassium: 4.8 mmol/L (ref 3.5–5.2)
Sodium: 140 mmol/L (ref 134–144)
eGFR: 76 mL/min/1.73 (ref >59)

## 2024-05-16 LAB — CBC
Hematocrit: 45.5 % (ref 37.5–51.0)
Hemoglobin: 15.2 g/dL (ref 13.0–17.7)
MCH: 31.8 pg (ref 26.6–33.0)
MCHC: 33.4 g/dL (ref 31.5–35.7)
MCV: 95 fL (ref 79–97)
Platelets: 246 x10E3/uL (ref 150–450)
RBC: 4.78 x10E6/uL (ref 4.14–5.80)
RDW: 13 % (ref 11.6–15.4)
WBC: 7.4 x10E3/uL (ref 3.4–10.8)

## 2024-05-20 ENCOUNTER — Ambulatory Visit: Payer: Self-pay | Admitting: Pulmonary Disease

## 2024-05-22 NOTE — Progress Notes (Signed)
 Flecainide  Initiation EKG Review   EKG: {ekg findings:315101}.  Stable intervals on Flecainide   Baseline PR >  Baseline QRS >   Post Flecainide   PR >  QRS >    Continue flecainide , pending DCCV 05/29/24 & follow up with Dr. Kennyth to discuss ablation 1 month post.    [name], NP-C, AGACNP-BC [facility] - Electrophysiology  05/22/2024, 10:02 AM   No charge / EKG visit.

## 2024-05-26 ENCOUNTER — Ambulatory Visit: Attending: Pulmonary Disease | Admitting: Pulmonary Disease

## 2024-05-26 ENCOUNTER — Encounter: Payer: Self-pay | Admitting: Pulmonary Disease

## 2024-05-26 DIAGNOSIS — I480 Paroxysmal atrial fibrillation: Secondary | ICD-10-CM | POA: Insufficient documentation

## 2024-05-26 DIAGNOSIS — D6869 Other thrombophilia: Secondary | ICD-10-CM | POA: Insufficient documentation

## 2024-05-28 ENCOUNTER — Telehealth: Payer: Self-pay | Admitting: *Deleted

## 2024-05-28 DIAGNOSIS — I10 Essential (primary) hypertension: Secondary | ICD-10-CM

## 2024-05-28 DIAGNOSIS — R002 Palpitations: Secondary | ICD-10-CM

## 2024-05-28 DIAGNOSIS — R0683 Snoring: Secondary | ICD-10-CM

## 2024-05-28 DIAGNOSIS — G4733 Obstructive sleep apnea (adult) (pediatric): Secondary | ICD-10-CM

## 2024-05-28 NOTE — Telephone Encounter (Signed)
-----   Message from Wilbert Bihari sent at 04/26/2024  7:35 PM EST ----- Please let patient know that they have sleep apnea.  Recommend therapeutic CPAP titration for treatment of patient's sleep disordered breathing.

## 2024-05-28 NOTE — Telephone Encounter (Signed)
 The patient has been notified of the result and verbalized understanding.  All questions (if any) were answered. Joshua Dalton Seip, CMA 05/28/2024 4:04 PM    TITRATION PRECERTED  ALREADY

## 2024-05-29 ENCOUNTER — Encounter (HOSPITAL_COMMUNITY): Admission: RE | Payer: Self-pay

## 2024-05-29 ENCOUNTER — Ambulatory Visit (HOSPITAL_COMMUNITY): Admission: RE | Admit: 2024-05-29 | Admitting: Internal Medicine

## 2024-05-29 SURGERY — CARDIOVERSION (CATH LAB)
Anesthesia: General

## 2024-06-10 ENCOUNTER — Telehealth: Payer: Self-pay | Admitting: Cardiology

## 2024-06-10 ENCOUNTER — Other Ambulatory Visit: Payer: Self-pay | Admitting: Pulmonary Disease

## 2024-06-10 MED ORDER — DILTIAZEM HCL ER COATED BEADS 180 MG PO CP24
180.0000 mg | ORAL_CAPSULE | Freq: Every day | ORAL | 3 refills | Status: DC
  Filled 2024-06-10: qty 90, 90d supply, fill #0

## 2024-06-10 NOTE — Telephone Encounter (Signed)
 Returns pt call. Pt is following up on a MyChart message he sent to [name], NP. Pt states increased sleepiness and overall feeling tired and off last 2 weeks. Not sure he feels the palpations so does not normally take his PRN cardizem  30mg . States his Fitbit shows signs of Afib every night. States his HR is normally in the 70's/80's. Pt states he has recently bought a Apple watch to track his HR and has pulse ox. Pt stated anxiety over the appointment for a Cpap at the end of the month and that he has not been sleepy well.  Pt has appointment with Dr Kennyth on 07/02/24. Pt stated he just wanted to let his Healthcare team know what was going on. Stated I would get this over for review and if there is any recommendations before his appointment we would reach back out.   MyChart message: Hello Ms. Barrack Jose Orr here with an update. The following info is from my Fitbit. In Nov. my resting heart rate was 57 bpm. On Dec. 2 it was 53 bmp. Over the last 2 weeks is has shot up every night, a steady climb up to 81 bpm. I have had AFib show up every night since the 2nd too. From the [employer] app...blood pressure is about the same...today it was 112/68.   I know I have an appointment on Jan. 6, but I thought I better let you know. Thank You for your time, [name]

## 2024-06-10 NOTE — Progress Notes (Signed)
 Prior phone note as below:   Mr. Daywalt,   Based on your description, your rates are fairly well controlled. When we did your last EKG after starting Flecainide  you were in normal rhythm.  We can increase you Cardizem  to 180 mg daily until we can get you back to Dr. Kennyth as planned in January.  I will send in a new Rx for you to the pharmacy.  You may feel tired when in /out of AF.  If you have sustained HR's > 120 bpm and symptoms, you would need to go to the ER for evaluation.     [name], NP-C, AGACNP-BC [facility] - Electrophysiology  06/10/2024, 9:00 PM

## 2024-06-10 NOTE — Telephone Encounter (Signed)
 Patient is calling about about his mychart message. Please advise

## 2024-06-11 ENCOUNTER — Other Ambulatory Visit (HOSPITAL_COMMUNITY): Payer: Self-pay

## 2024-06-12 ENCOUNTER — Other Ambulatory Visit (HOSPITAL_COMMUNITY): Payer: Self-pay

## 2024-06-12 ENCOUNTER — Telehealth: Payer: Self-pay | Admitting: Pulmonary Disease

## 2024-06-12 ENCOUNTER — Other Ambulatory Visit: Payer: Self-pay | Admitting: Cardiology

## 2024-06-12 MED ORDER — APIXABAN 5 MG PO TABS
5.0000 mg | ORAL_TABLET | Freq: Two times a day (BID) | ORAL | 3 refills | Status: AC
  Filled 2024-06-12: qty 60, 30d supply, fill #0

## 2024-06-12 NOTE — Telephone Encounter (Signed)
 Eliquis  5mg  refill request received. Patient is 68 years old, weight-119.7kg, Crea-1.05 on 05/15/24, Diagnosis-Afib, and last seen by [name] on 05/26/24. Dose is appropriate based on dosing criteria.   Eliquis  5mg  refill sent today by another user in another office will not resend

## 2024-06-12 NOTE — Telephone Encounter (Signed)
" °*  STAT* If patient is at the pharmacy, call can be transferred to refill team.   1. Which medications need to be refilled? (please list name of each medication and dose if known)  apixaban  (ELIQUIS ) 5 MG TABS tablet   2. Would you like to learn more about the convenience, safety, & potential cost savings by using the [facility]? no   3. Are you open to using the [facility] (Type [facility]. no   4. Which pharmacy/location (including street and city if local pharmacy) is medication to be sent to? [facility] [place]    5. Do they need a 30 day or 90 day supply?  30 day    "

## 2024-06-22 ENCOUNTER — Ambulatory Visit (HOSPITAL_BASED_OUTPATIENT_CLINIC_OR_DEPARTMENT_OTHER): Attending: Cardiology | Admitting: Cardiology

## 2024-06-22 DIAGNOSIS — R002 Palpitations: Secondary | ICD-10-CM | POA: Diagnosis present

## 2024-06-22 DIAGNOSIS — R0683 Snoring: Secondary | ICD-10-CM | POA: Diagnosis present

## 2024-06-22 DIAGNOSIS — I493 Ventricular premature depolarization: Secondary | ICD-10-CM | POA: Insufficient documentation

## 2024-06-22 DIAGNOSIS — G4733 Obstructive sleep apnea (adult) (pediatric): Secondary | ICD-10-CM | POA: Diagnosis not present

## 2024-06-22 DIAGNOSIS — I10 Essential (primary) hypertension: Secondary | ICD-10-CM | POA: Diagnosis not present

## 2024-06-23 NOTE — Procedures (Signed)
 " Indications for Polysomnography The patient is a 68 year old Male who is 6' and weighs 265.0 lbs. His BMI equals 36.3.  A full night titration treatment study was performed.  These medications were reported taken prior to start of study.Lorazepam  was taken at 8:21 pm.Naproxen was taken at 9:53 pm.Gabapentin  was taken at 9:53 pm. Polysomnogram Data A full night polysomnogram recorded the standard physiologic parameters including EEG, EOG, EMG, EKG, nasal and oral airflow.  Respiratory parameters of chest and abdominal movements were recorded with Respiratory Inductance Plethysmography belts.   Oxygen saturation was recorded by pulse oximetry.  Sleep Architecture The total recording time of the polysomnogram was 429.6 minutes.  The total sleep time was 389.0 minutes.  The patient spent 8.2% of total sleep time in Stage N1, 73.0% in Stage N2, 0.4% in Stages N3, and 18.4% in REM.  Sleep latency was 14.9 minutes.   REM latency was 57.5 minutes.  Sleep Efficiency was 90.5%.  Wake after Sleep Onset time was 25.5 minutes.  Titration Summary The patient was titrated at pressures ranging from 6cm/H20 up to 17 cm/H20.  The last pressure used in the study was 17 cm/H20.  Respiratory Events The polysomnogram revealed a presence of 0 obstructive, 1 central, and 0 mixed apneas resulting in an Apnea index of 0.2 events per hour.  There were 50 hypopneas (GreaterEqual to3% desaturation and/or arousal) resulting in an Apnea\Hypopnea Index (AHI  GreaterEqual to3% desaturation and/or arousal) of 7.9 events per hour.  There were 17 hypopneas (GreaterEqual to4% desaturation) resulting in an Apnea\Hypopnea Index (AHI GreaterEqual to4% desaturation) of 2.8 events per hour.  There were 26 Respiratory  Effort Related Arousals resulting in a RERA index of 4.0 events per hour. The Respiratory Disturbance Index is 11.9 events per hour.  The snore index was 0 events per hour.  Mean oxygen saturation was 94.5%.  The lowest  oxygen saturation during sleep was 87.0%.  Time spent LessEqual to88% oxygen saturation was  minutes ().  Limb Activity There were 517 limb movements recorded.  Of this total, 511 were classified as PLMs.  Of the PLMs, 50 were associated with arousals.  The Limb Movement index was 79.7 per hour while the PLM index was 78.8 per hour.  Cardiac Summary The average pulse rate was 110.9 bpm.  The minimum pulse rate was 92.0 bpm while the maximum pulse rate was 116.0 bpm.  Cardiac rhythm was NSR with PVCs  Diagnosis: Obstructive Sleep Apnea Successful CPAP titration PVCs   Recommendations: 1. Recommend a trial of ResMed CPAP at 15cm H2O with heated humidity and medium ResMed AirFIt N30 mask with small chin strap. 2. Close follow-up is necessary to ensure success with CPAP for maximum benefit. 3. A follow-up oximetry study on CPAP is recommended to assess the adequacy of therapy and determine the need for supplemental oxygen or the potential need for Bi-level therapy.  An arterial blood gas to determine the adequacy of baseline ventilation and  oxygenation should also be considered. 4. Healthy sleep recommendations include:  adequate nightly sleep (normal 7-9 hrs/night), avoidance of caffeine after noon and alcohol near bedtime, and maintaining a sleep environment that is cool, dark and quiet. 5. Weight loss for overweight patients is recommended.  Even modest amounts of weight loss can significantly improve the severity of sleep apnea 6. Snoring recommendations include:  weight loss where appropriate, side sleeping, and avoidance of alcohol before bed. 7. Operation of motor vehicle should be avoided when sleepy.  This study was personally reviewed  and electronically signed by: Dr. Wilbert Bihari Accredited Board Certified in Sleep Medicine Date/Time: 06/23/2024 5:29PM "

## 2024-06-29 NOTE — Progress Notes (Signed)
 " Electrophysiology Office Note:   Date:  07/03/2024  ID:  Jose Orr, DOB 1955/09/11, MRN [id number]  Primary Cardiologist: Newman JINNY Lawrence, MD Electrophysiologist: Fonda Kitty, MD      History of Present Illness:   Jose Orr is a 69 y.o. male with h/o hypertension, hyperlipidemia, diabetes, tongue cancer and atrial fibrillation who is being seen today for evaluation of catheter ablation at the request of [name], NP.  Discussed the use of AI scribe software for clinical note transcription with the patient, who gave verbal consent to proceed.  History of Present Illness Jose Orr is a 69 year old male with atrial fibrillation who presents with concerns about his heart rhythm and elevated resting heart rate. He is accompanied by his wife. He was referred for evaluation of his atrial fibrillation and consideration of a Watchman device.  Since December, he has experienced a significant increase in his resting heart rate, which has been consistently elevated compared to previous months. Previously, his heart rate was in the mid-fifties but has recently been around 100 beats per minute. He describes this change as 'really scary' and states that December was the 'worst' month he has experienced.  He has a history of atrial fibrillation and has been on flecainide , which he has been taking diligently. However, he feels that the medication has not been effective, as he continues to experience symptoms and a persistently elevated heart rate. He mentions feeling 'locked in' at a heart rate of 115 beats per minute.  His condition has significantly impacted his quality of life, as he has not felt well for the entire month and has been unable to engage in activities he previously enjoyed, such as biking and hiking with his wife. He describes feeling 'held hostage' by his condition for over a year.  He has been on diltiazem , with a current dose of 180 mg time-release, and is also taking  Eliquis  as a blood thinner. He has been monitoring his blood pressure and heart rate, noting fluctuations and receiving calls from monitoring services due to concerning readings.  He underwent a sleep study on June 22, 2024, and is awaiting results. He has been provided with a starter kit for sleep apnea management but has not yet received the full equipment.  His wife, who is recovering from heart surgery, supports him in his care and notes his decreased activity level over the past year.   Review of systems complete and found to be negative unless listed in HPI.   EP Information / Studies Reviewed:    EKG is ordered today. Personal review as below.  EKG Interpretation Date/Time:  Tuesday June 30 2024 08:14:09 EST Ventricular Rate:  117 PR Interval:    QRS Duration:  116 QT Interval:  360 QTC Calculation: 502 R Axis:   160  Text Interpretation: Atrial flutter Left posterior fascicular block When compared with ECG of 26-May-2024 08:29, Atrial flutter has replaced Sinus rhythm Vent. rate has increased BY  60 BPM Confirmed by Kitty Fonda [phone]) on 07/03/2024 12:48:32 PM   ECG 05/15/24:   Zio 02/05/24:     LHC 09/17/22:  Normal epicardial coronary arteries withotu significant epicardial disease.  Slow flow in all vessels Normal LVEDP Mildly elevated IMR, suggests possible microvascular dysfunction  Echo 09/17/22:  1. Left ventricular ejection fraction, by estimation, is 60 to 65%. The  left ventricle has normal function. The left ventricle has no regional  wall motion abnormalities. Left ventricular diastolic parameters are  consistent with Grade  II diastolic  dysfunction (pseudonormalization).   2. Right ventricular systolic function is normal. The right ventricular  size is normal. There is normal pulmonary artery systolic pressure.   3. Left atrial size was mild to moderately dilated.   4. Right atrial size was mildly dilated.   5. The mitral valve is normal in  structure. Trivial mitral valve  regurgitation. No evidence of mitral stenosis.   6. The aortic valve is normal in structure. There is mild calcification  of the aortic valve. Aortic valve regurgitation is trivial. Mild aortic  valve stenosis. Aortic valve mean gradient measures 6.0 mmHg. Aortic valve  Vmax measures 1.67 m/s.   7. The inferior vena cava is normal in size with greater than 50%  respiratory variability, suggesting right atrial pressure of 3 mmHg.   Risk Assessment/Calculations:    CHA2DS2-VASc Score = 3   This indicates a 3.2% annual risk of stroke. The patient's score is based upon: CHF History: 0 HTN History: 1 Diabetes History: 1 Stroke History: 0 Vascular Disease History: 0 Age Score: 1 Gender Score: 0      Physical Exam:   VS:  BP 110/80 (BP Location: Left Arm, Patient Position: Sitting, Cuff Size: Large)   Pulse (!) 117   Ht 6' (1.829 m)   Wt 268 lb 9.6 oz (121.8 kg)   SpO2 95%   BMI 36.43 kg/m    Wt Readings from Last 3 Encounters:  06/30/24 268 lb 9.6 oz (121.8 kg)  06/23/24 265 lb (120.2 kg)  05/15/24 263 lb 12.8 oz (119.7 kg)     GEN: Well nourished, well developed in no acute distress NECK: No JVD CARDIAC: Tachycardic and regular RESPIRATORY:  Clear to auscultation without rales, wheezing or rhonchi  ABDOMEN: Soft, non-distended EXTREMITIES:  No edema; No deformity   ASSESSMENT AND PLAN:    #Symptomatic paroxysmal AF:  #Atrial flutter: ECGs prior to today look more typical; today's less so. #High risk medication use: Flecainide .  -Discussed treatment options today for AF including antiarrhythmic drug therapy and ablation. Discussed risks, recovery and likelihood of success with each treatment strategy. Risk, benefits, and alternatives to EP study and ablation for afib were discussed. These risks include but are not limited to stroke, bleeding, vascular damage, tamponade, perforation, damage to the esophagus, lungs, phrenic nerve and other  structures, pulmonary vein stenosis, worsening renal function, coronary vasospasm and death.  Discussed potential need for repeat ablation procedures and antiarrhythmic drugs after an initial ablation. The patient understands these risk and wishes to proceed.  We will therefore proceed with catheter ablation at the next available time.  Carto, ICE, anesthesia are requested for the procedure.   - Discontinue flecainide . - Initiate amiodarone  as a bridge to ablation. After loading with amiodarone , will repeat cardioversion.  - Monitor heart rate and rhythm with iWatch ECG feature. - If heart rate drops below 60 bpm after conversion, discontinue diltiazem . Continue diltiazem  if heart rate remains above 60 bpm after conversion.  #Secondary hypercoagulable state due to AF: He has a CHA2DS2-VASc score of 3. -He has not had any overt bleeding issues but is concerned about being on Eliquis  as he rides a bike for exercise.  We discussed that if he does not have atrial fibrillation post ablation that anticoagulation could potentially be discontinued given his low CHA2DS2-VASc score as long as he has regular cardiac rhythm monitoring.  If he were to have recurrence of AF then Watchman device would be appropriate. -Continue Eliquis  for now.   #  Hypertension -At goal today.  Recommend checking blood pressures 1-2 times per week at home and recording the values.  Recommend bringing these recordings to the primary care physician.  #Sleep disordered breathing: Undergoing evaluation for obstructive sleep apnea. Treatment of sleep apnea is important for maintaining normal rhythm post-ablation. Higher success rate in maintaining normal rhythm with treated sleep apnea. - Complete sleep apnea evaluation and initiate treatment as recommended.    Follow up with EP Team 3 months after ablation.   Signed, Fonda Kitty, MD  "

## 2024-06-29 NOTE — H&P (View-Only) (Signed)
 " Electrophysiology Office Note:   Date:  07/03/2024  ID:  Jose Orr, DOB 1955/09/11, MRN [id number]  Primary Cardiologist: Newman JINNY Lawrence, MD Electrophysiologist: Fonda Kitty, MD      History of Present Illness:   Jose Orr is a 69 y.o. male with h/o hypertension, hyperlipidemia, diabetes, tongue cancer and atrial fibrillation who is being seen today for evaluation of catheter ablation at the request of [name], NP.  Discussed the use of AI scribe software for clinical note transcription with the patient, who gave verbal consent to proceed.  History of Present Illness Jose Orr is a 69 year old male with atrial fibrillation who presents with concerns about his heart rhythm and elevated resting heart rate. He is accompanied by his wife. He was referred for evaluation of his atrial fibrillation and consideration of a Watchman device.  Since December, he has experienced a significant increase in his resting heart rate, which has been consistently elevated compared to previous months. Previously, his heart rate was in the mid-fifties but has recently been around 100 beats per minute. He describes this change as 'really scary' and states that December was the 'worst' month he has experienced.  He has a history of atrial fibrillation and has been on flecainide , which he has been taking diligently. However, he feels that the medication has not been effective, as he continues to experience symptoms and a persistently elevated heart rate. He mentions feeling 'locked in' at a heart rate of 115 beats per minute.  His condition has significantly impacted his quality of life, as he has not felt well for the entire month and has been unable to engage in activities he previously enjoyed, such as biking and hiking with his wife. He describes feeling 'held hostage' by his condition for over a year.  He has been on diltiazem , with a current dose of 180 mg time-release, and is also taking  Eliquis  as a blood thinner. He has been monitoring his blood pressure and heart rate, noting fluctuations and receiving calls from monitoring services due to concerning readings.  He underwent a sleep study on June 22, 2024, and is awaiting results. He has been provided with a starter kit for sleep apnea management but has not yet received the full equipment.  His wife, who is recovering from heart surgery, supports him in his care and notes his decreased activity level over the past year.   Review of systems complete and found to be negative unless listed in HPI.   EP Information / Studies Reviewed:    EKG is ordered today. Personal review as below.  EKG Interpretation Date/Time:  Tuesday June 30 2024 08:14:09 EST Ventricular Rate:  117 PR Interval:    QRS Duration:  116 QT Interval:  360 QTC Calculation: 502 R Axis:   160  Text Interpretation: Atrial flutter Left posterior fascicular block When compared with ECG of 26-May-2024 08:29, Atrial flutter has replaced Sinus rhythm Vent. rate has increased BY  60 BPM Confirmed by Kitty Fonda [phone]) on 07/03/2024 12:48:32 PM   ECG 05/15/24:   Zio 02/05/24:     LHC 09/17/22:  Normal epicardial coronary arteries withotu significant epicardial disease.  Slow flow in all vessels Normal LVEDP Mildly elevated IMR, suggests possible microvascular dysfunction  Echo 09/17/22:  1. Left ventricular ejection fraction, by estimation, is 60 to 65%. The  left ventricle has normal function. The left ventricle has no regional  wall motion abnormalities. Left ventricular diastolic parameters are  consistent with Grade  II diastolic  dysfunction (pseudonormalization).   2. Right ventricular systolic function is normal. The right ventricular  size is normal. There is normal pulmonary artery systolic pressure.   3. Left atrial size was mild to moderately dilated.   4. Right atrial size was mildly dilated.   5. The mitral valve is normal in  structure. Trivial mitral valve  regurgitation. No evidence of mitral stenosis.   6. The aortic valve is normal in structure. There is mild calcification  of the aortic valve. Aortic valve regurgitation is trivial. Mild aortic  valve stenosis. Aortic valve mean gradient measures 6.0 mmHg. Aortic valve  Vmax measures 1.67 m/s.   7. The inferior vena cava is normal in size with greater than 50%  respiratory variability, suggesting right atrial pressure of 3 mmHg.   Risk Assessment/Calculations:    CHA2DS2-VASc Score = 3   This indicates a 3.2% annual risk of stroke. The patient's score is based upon: CHF History: 0 HTN History: 1 Diabetes History: 1 Stroke History: 0 Vascular Disease History: 0 Age Score: 1 Gender Score: 0      Physical Exam:   VS:  BP 110/80 (BP Location: Left Arm, Patient Position: Sitting, Cuff Size: Large)   Pulse (!) 117   Ht 6' (1.829 m)   Wt 268 lb 9.6 oz (121.8 kg)   SpO2 95%   BMI 36.43 kg/m    Wt Readings from Last 3 Encounters:  06/30/24 268 lb 9.6 oz (121.8 kg)  06/23/24 265 lb (120.2 kg)  05/15/24 263 lb 12.8 oz (119.7 kg)     GEN: Well nourished, well developed in no acute distress NECK: No JVD CARDIAC: Tachycardic and regular RESPIRATORY:  Clear to auscultation without rales, wheezing or rhonchi  ABDOMEN: Soft, non-distended EXTREMITIES:  No edema; No deformity   ASSESSMENT AND PLAN:    #Symptomatic paroxysmal AF:  #Atrial flutter: ECGs prior to today look more typical; today's less so. #High risk medication use: Flecainide .  -Discussed treatment options today for AF including antiarrhythmic drug therapy and ablation. Discussed risks, recovery and likelihood of success with each treatment strategy. Risk, benefits, and alternatives to EP study and ablation for afib were discussed. These risks include but are not limited to stroke, bleeding, vascular damage, tamponade, perforation, damage to the esophagus, lungs, phrenic nerve and other  structures, pulmonary vein stenosis, worsening renal function, coronary vasospasm and death.  Discussed potential need for repeat ablation procedures and antiarrhythmic drugs after an initial ablation. The patient understands these risk and wishes to proceed.  We will therefore proceed with catheter ablation at the next available time.  Carto, ICE, anesthesia are requested for the procedure.   - Discontinue flecainide . - Initiate amiodarone  as a bridge to ablation. After loading with amiodarone , will repeat cardioversion.  - Monitor heart rate and rhythm with iWatch ECG feature. - If heart rate drops below 60 bpm after conversion, discontinue diltiazem . Continue diltiazem  if heart rate remains above 60 bpm after conversion.  #Secondary hypercoagulable state due to AF: He has a CHA2DS2-VASc score of 3. -He has not had any overt bleeding issues but is concerned about being on Eliquis  as he rides a bike for exercise.  We discussed that if he does not have atrial fibrillation post ablation that anticoagulation could potentially be discontinued given his low CHA2DS2-VASc score as long as he has regular cardiac rhythm monitoring.  If he were to have recurrence of AF then Watchman device would be appropriate. -Continue Eliquis  for now.   #  Hypertension -At goal today.  Recommend checking blood pressures 1-2 times per week at home and recording the values.  Recommend bringing these recordings to the primary care physician.  #Sleep disordered breathing: Undergoing evaluation for obstructive sleep apnea. Treatment of sleep apnea is important for maintaining normal rhythm post-ablation. Higher success rate in maintaining normal rhythm with treated sleep apnea. - Complete sleep apnea evaluation and initiate treatment as recommended.    Follow up with EP Team 3 months after ablation.   Signed, Fonda Kitty, MD  "

## 2024-06-30 ENCOUNTER — Encounter: Payer: Self-pay | Admitting: Cardiology

## 2024-06-30 ENCOUNTER — Ambulatory Visit: Attending: Cardiology | Admitting: Cardiology

## 2024-06-30 ENCOUNTER — Other Ambulatory Visit: Payer: Self-pay

## 2024-06-30 VITALS — BP 110/80 | HR 117 | Ht 72.0 in | Wt 268.6 lb

## 2024-06-30 DIAGNOSIS — I10 Essential (primary) hypertension: Secondary | ICD-10-CM

## 2024-06-30 DIAGNOSIS — R0789 Other chest pain: Secondary | ICD-10-CM

## 2024-06-30 DIAGNOSIS — D6869 Other thrombophilia: Secondary | ICD-10-CM | POA: Insufficient documentation

## 2024-06-30 DIAGNOSIS — R002 Palpitations: Secondary | ICD-10-CM

## 2024-06-30 DIAGNOSIS — Z79899 Other long term (current) drug therapy: Secondary | ICD-10-CM | POA: Diagnosis present

## 2024-06-30 DIAGNOSIS — I480 Paroxysmal atrial fibrillation: Secondary | ICD-10-CM | POA: Insufficient documentation

## 2024-06-30 DIAGNOSIS — G4733 Obstructive sleep apnea (adult) (pediatric): Secondary | ICD-10-CM | POA: Insufficient documentation

## 2024-06-30 DIAGNOSIS — I2585 Chronic coronary microvascular dysfunction: Secondary | ICD-10-CM

## 2024-06-30 DIAGNOSIS — I483 Typical atrial flutter: Secondary | ICD-10-CM | POA: Insufficient documentation

## 2024-06-30 MED ORDER — AMIODARONE HCL 200 MG PO TABS: ORAL_TABLET | ORAL | 3 refills | Status: AC

## 2024-06-30 NOTE — Patient Instructions (Addendum)
 Medication Instructions:  Your physician has recommended you make the following change in your medication:  1) STOP taking flecainide   2) START taking amiodarone  400 mg twice daily for 1 week, 200 mg twice daily for 1 week, 200 mg once daily thereafter  *If you need a refill on your cardiac medications before your next appointment, please call your pharmacy*  Labs:  TODAY: BMET and CBC  Testing/Procedures: Cardioversion  Your physician has recommended that you have a Cardioversion (DCCV). Electrical Cardioversion uses a jolt of electricity to your heart either through paddles or wired patches attached to your chest. This is a controlled, usually prescheduled, procedure. Defibrillation is done under light anesthesia in the hospital, and you usually go home the day of the procedure. This is done to get your heart back into a normal rhythm. You are not awake for the procedure. Please see the instruction sheet given to you today.  Ablation Your physician has recommended that you have an ablation. Catheter ablation is a medical procedure used to treat some cardiac arrhythmias (irregular heartbeats). During catheter ablation, a long, thin, flexible tube is put into a blood vessel in your groin (upper thigh), or neck. This tube is called an ablation catheter. It is then guided to your heart through the blood vessel. Radio frequency waves destroy small areas of heart tissue where abnormal heartbeats may cause an arrhythmia to start.   You are scheduled for Atrial Fibrillation/Flutter Ablation on Thursday, February 12 with Dr. Dr. Kennyth. Please arrive at the Main Entrance A at [facility]: [address] [place], [place] [address] at 9:30 AM   What To Expect:  Labs: you will need to have lab work drawn within 30 days of your procedure. Please go to any [facility] location to have these drawn - no appointment is needed. You will receive procedure instructions either through MyChart or in the mail  about 4 weeks prior to your procedure.  After your procedure we recommend no driving for 4 days, no lifting over 5 lbs for 7 days, and no work or strenuous activity for 7 days.  Please contact our office at [phone] if you have any questions.    Follow-Up: We will contact you to schedule your post-procedure appointments.

## 2024-07-01 LAB — CBC
Hematocrit: 45.4 % (ref 37.5–51.0)
Hemoglobin: 14.9 g/dL (ref 13.0–17.7)
MCH: 31 pg (ref 26.6–33.0)
MCHC: 32.8 g/dL (ref 31.5–35.7)
MCV: 95 fL (ref 79–97)
Platelets: 271 x10E3/uL (ref 150–450)
RBC: 4.8 x10E6/uL (ref 4.14–5.80)
RDW: 12.9 % (ref 11.6–15.4)
WBC: 7.8 x10E3/uL (ref 3.4–10.8)

## 2024-07-01 LAB — BASIC METABOLIC PANEL WITH GFR
BUN/Creatinine Ratio: 18 (ref 10–24)
BUN: 17 mg/dL (ref 8–27)
CO2: 22 mmol/L (ref 20–29)
Calcium: 10 mg/dL (ref 8.6–10.2)
Chloride: 99 mmol/L (ref 96–106)
Creatinine, Ser: 0.95 mg/dL (ref 0.76–1.27)
Glucose: 147 mg/dL — ABNORMAL HIGH (ref 70–99)
Potassium: 3.9 mmol/L (ref 3.5–5.2)
Sodium: 138 mmol/L (ref 134–144)
eGFR: 86 mL/min/1.73

## 2024-07-04 ENCOUNTER — Ambulatory Visit: Payer: Self-pay | Admitting: Cardiology

## 2024-07-06 ENCOUNTER — Telehealth: Payer: Self-pay | Admitting: *Deleted

## 2024-07-06 ENCOUNTER — Telehealth: Payer: Self-pay

## 2024-07-06 NOTE — Telephone Encounter (Signed)
-----   Message from Wilbert Bihari, MD sent at 06/23/2024  5:31 PM EST ----- Please let patient know that they had a successful PAP titration and let DME know that orders are in EPIC.  Please set up 6 week OV with me.

## 2024-07-06 NOTE — Telephone Encounter (Signed)
 The patient has been notified of the result and verbalized understanding.  All questions (if any) were answered. Joshua Dalton Seip, CMA 07/06/2024 12:28 PM    Upon patient request DME selection is [facility]. Patient understands he will be contacted by [facility] to set up his cpap. Patient understands to call if [facility] does not contact him with new setup in a timely manner. Patient understands they will be called once confirmation has been received from [facility]/ that they have received their new machine to schedule 10 week follow up appointment.   [facility] notified of new cpap order  Please add to airview Patient was grateful for the call and thanked me.

## 2024-07-06 NOTE — Telephone Encounter (Signed)
-----   Message from Nurse Doreatha BROCKS, RN sent at 06/30/2024  9:57 AM EST ----- Regarding: 08/06/24 afib/flutter ablation Precert:  MD: Kennyth Type of ablation: A-fib/flutter Diagnosis: afib/flutter CPT code: A-fib (06343) and 06344 Ablation scheduled (date/time): 2/12 at 1130  Procedure:  Added to calendar? Yes Orders entered? Yes Letter complete? No, >30 days before procedure Scheduled with cath lab? Yes Any medications to hold? No Labs ordered (CBC, BMET, PT/INR if on warfarin): No Mapping system: Doesn't matter [employer] rep notified? No Cardiac CT needed? No Dye allergy? No Pre-meds ordered and instructions given? No, not needed Letter method: MyChart H&P: 1/6 Device: No  Follow-up:  Cassie/Angel, please schedule Routine.  Covering RN - please send this message to [employer], EP scheduler, EP Scheduling pool, EP [employer], and CT scheduler (Brittany Lynch/Stephanie Mogg), if indicated.

## 2024-07-08 ENCOUNTER — Ambulatory Visit (HOSPITAL_COMMUNITY): Admission: RE | Admit: 2024-07-08 | Discharge: 2024-07-08 | Disposition: A | Discharge status: Home / Self Care

## 2024-07-08 ENCOUNTER — Other Ambulatory Visit: Payer: Self-pay

## 2024-07-08 ENCOUNTER — Ambulatory Visit (HOSPITAL_COMMUNITY): Admitting: Anesthesiology

## 2024-07-08 ENCOUNTER — Encounter (HOSPITAL_COMMUNITY): Payer: Self-pay

## 2024-07-08 ENCOUNTER — Encounter (HOSPITAL_COMMUNITY): Admission: RE | Disposition: A | Discharge status: Home / Self Care | Payer: Self-pay | Source: Home / Self Care

## 2024-07-08 DIAGNOSIS — I4891 Unspecified atrial fibrillation: Secondary | ICD-10-CM | POA: Diagnosis not present

## 2024-07-08 DIAGNOSIS — E7800 Pure hypercholesterolemia, unspecified: Secondary | ICD-10-CM

## 2024-07-08 DIAGNOSIS — E119 Type 2 diabetes mellitus without complications: Secondary | ICD-10-CM

## 2024-07-08 DIAGNOSIS — Z7901 Long term (current) use of anticoagulants: Secondary | ICD-10-CM | POA: Insufficient documentation

## 2024-07-08 DIAGNOSIS — I10 Essential (primary) hypertension: Secondary | ICD-10-CM | POA: Insufficient documentation

## 2024-07-08 DIAGNOSIS — G4730 Sleep apnea, unspecified: Secondary | ICD-10-CM | POA: Insufficient documentation

## 2024-07-08 DIAGNOSIS — Z01818 Encounter for other preprocedural examination: Secondary | ICD-10-CM

## 2024-07-08 DIAGNOSIS — I4892 Unspecified atrial flutter: Secondary | ICD-10-CM | POA: Diagnosis not present

## 2024-07-08 DIAGNOSIS — D6869 Other thrombophilia: Secondary | ICD-10-CM | POA: Insufficient documentation

## 2024-07-08 DIAGNOSIS — I4819 Other persistent atrial fibrillation: Secondary | ICD-10-CM | POA: Insufficient documentation

## 2024-07-08 HISTORY — PX: CARDIOVERSION: EP1203

## 2024-07-08 MED ORDER — LIDOCAINE 2% (20 MG/ML) 5 ML SYRINGE
INTRAMUSCULAR | Status: DC | PRN
  Administered 2024-07-08: 50 mg via INTRAVENOUS

## 2024-07-08 MED ORDER — PROPOFOL 10 MG/ML IV BOLUS
INTRAVENOUS | Status: DC | PRN
  Administered 2024-07-08: 70 mg via INTRAVENOUS

## 2024-07-08 MED ORDER — SODIUM CHLORIDE 0.9 % IV SOLN: INTRAVENOUS | Status: DC

## 2024-07-08 NOTE — Interval H&P Note (Signed)
 History and Physical Interval Note:  07/08/2024 9:09 AM  Jose Orr  has presented today for surgery, with the diagnosis of afib.  The various methods of treatment have been discussed with the patient and family. After consideration of risks, benefits and other options for treatment, the patient has consented to  Procedures: CARDIOVERSION (N/A) as a surgical intervention.  The patient's history has been reviewed, patient examined, no change in status, stable for surgery.  I have reviewed the patient's chart and labs.  Questions were answered to the patient's satisfaction.     [name]

## 2024-07-08 NOTE — Anesthesia Postprocedure Evaluation (Signed)
"   Anesthesia Post Note  Patient: Jose Orr  Procedure(s) Performed: CARDIOVERSION     Patient location during evaluation: Cath Lab Anesthesia Type: General Level of consciousness: awake and alert Pain management: pain level controlled Vital Signs Assessment: post-procedure vital signs reviewed and stable Respiratory status: spontaneous breathing, nonlabored ventilation and respiratory function stable Cardiovascular status: blood pressure returned to baseline and stable Postop Assessment: no apparent nausea or vomiting Anesthetic complications: no   There were no known notable events for this encounter.  Last Vitals:  Vitals:   07/08/24 1002 07/08/24 1010  BP: (!) 94/59 97/72  Pulse: (!) 59 (!) 56  Resp: 17 (!) 28  Temp:    SpO2: 97% 97%    Last Pain:  Vitals:   07/08/24 1002  TempSrc:   PainSc: 0-No pain                 Sanvi Ehler      "

## 2024-07-08 NOTE — Transfer of Care (Signed)
 Immediate Anesthesia Transfer of Care Note  Patient: Jose Orr  Procedure(s) Performed: CARDIOVERSION  Patient Location: Cath Lab  Anesthesia Type:General  Level of Consciousness: drowsy  Airway & Oxygen Therapy: Patient Spontanous Breathing and Patient connected to nasal cannula oxygen  Post-op Assessment: Report given to RN and Post -op Vital signs reviewed and stable  Post vital signs: Reviewed and stable  Last Vitals:  Vitals Value Taken Time  BP 132/90 07/08/24 09:03  Temp    Pulse 114 07/08/24 09:06  Resp 15 07/08/24 09:06  SpO2 98 % 07/08/24 09:06  Vitals shown include unfiled device data.  Last Pain:  Vitals:   07/08/24 0842  TempSrc: Tympanic  PainSc: 1          Complications: There were no known notable events for this encounter.

## 2024-07-08 NOTE — CV Procedure (Signed)
" ° °  DIRECT CURRENT CARDIOVERSION  NAME:  Jose Orr    MRN: [id number] DOB:  [birth year]    ADMIT DATE: 07/08/2024  Indication:  Symptomatic atrial fibrillation  Procedure Note:  The patient signed informed consent.  They have had had therapeutic anticoagulation with greater than 3 weeks.  Anesthesia was administered by Dr. Leopoldo.  Adequate airway was maintained throughout and vital followed per protocol.  They were cardioverted x 1 with 200J of biphasic synchronized energy.  They converted to sinus bradycardia.  There were no apparent complications.  The patient had normal neuro status and respiratory status post procedure with vitals stable as recorded elsewhere.    Follow up: We discontinued his diltiazem  given HR < 60 per EP clinic recs.  Joelle DEL. Ren Ny, MD, [facility]  [facility]  9:14 AM  "

## 2024-07-08 NOTE — Anesthesia Preprocedure Evaluation (Signed)
 "                                  Anesthesia Evaluation  Patient identified by MRN, date of birth, ID band Patient awake    Reviewed: Allergy & Precautions, NPO status , Patient's Chart, lab work & pertinent test results  History of Anesthesia Complications Negative for: history of anesthetic complications  Airway Mallampati: III  TM Distance: >3 FB Neck ROM: Full    Dental  (+) Dental Advisory Given, Teeth Intact   Pulmonary neg shortness of breath, sleep apnea , neg COPD, neg recent URI   breath sounds clear to auscultation       Cardiovascular hypertension, Pt. on medications (-) angina (-) CAD and (-) Cardiac Stents + dysrhythmias Atrial Fibrillation  Rhythm:Irregular  1. Left ventricular ejection fraction, by estimation, is 60 to 65%. The  left ventricle has normal function. The left ventricle has no regional  wall motion abnormalities. Left ventricular diastolic parameters are  consistent with Grade II diastolic  dysfunction (pseudonormalization).   2. Right ventricular systolic function is normal. The right ventricular  size is normal. There is normal pulmonary artery systolic pressure.   3. Left atrial size was mild to moderately dilated.   4. Right atrial size was mildly dilated.   5. The mitral valve is normal in structure. Trivial mitral valve  regurgitation. No evidence of mitral stenosis.   6. The aortic valve is normal in structure. There is mild calcification  of the aortic valve. Aortic valve regurgitation is trivial. Mild aortic  valve stenosis. Aortic valve mean gradient measures 6.0 mmHg. Aortic valve  Vmax measures 1.67 m/s.   7. The inferior vena cava is normal in size with greater than 50%  respiratory variability, suggesting right atrial pressure of 3 mmHg.      Neuro/Psych neg Seizures negative neurological ROS  negative psych ROS   GI/Hepatic negative GI ROS, Neg liver ROS,,,  Endo/Other  diabetes, Type 2    Renal/GU negative  Renal ROSLab Results      Component                Value               Date                      NA                       138                 06/30/2024                K                        3.9                 06/30/2024                CO2                      22                  06/30/2024                GLUCOSE  147 (H)             06/30/2024                BUN                      17                  06/30/2024                CREATININE               0.95                06/30/2024                CALCIUM                  10.0                06/30/2024                EGFR                     86                  06/30/2024                GFRNONAA                 >60                 09/18/2022                Musculoskeletal negative musculoskeletal ROS (+)    Abdominal   Peds  Hematology  (+) Blood dyscrasia Lab Results      Component                Value               Date                      WBC                      7.8                 06/30/2024                HGB                      14.9                06/30/2024                HCT                      45.4                06/30/2024                MCV                      95                  06/30/2024                PLT  271                 06/30/2024             eliquis    Anesthesia Other Findings   Reproductive/Obstetrics                              Anesthesia Physical Anesthesia Plan  ASA: 3  Anesthesia Plan: General   Post-op Pain Management: Minimal or no pain anticipated   Induction: Intravenous  PONV Risk Score and Plan: 2 and Treatment may vary due to age or medical condition  Airway Management Planned: Nasal Cannula, Simple Face Mask and Natural Airway  Additional Equipment: None  Intra-op Plan:   Post-operative Plan:   Informed Consent: I have reviewed the patients History and Physical, chart, labs and discussed the procedure  including the risks, benefits and alternatives for the proposed anesthesia with the patient or authorized representative who has indicated his/her understanding and acceptance.     Dental advisory given  Plan Discussed with: CRNA  Anesthesia Plan Comments:          Anesthesia Quick Evaluation  "

## 2024-07-10 ENCOUNTER — Encounter (HOSPITAL_COMMUNITY): Payer: Self-pay

## 2024-07-10 ENCOUNTER — Telehealth (HOSPITAL_COMMUNITY): Payer: Self-pay

## 2024-07-10 ENCOUNTER — Other Ambulatory Visit (HOSPITAL_COMMUNITY): Payer: Self-pay

## 2024-07-10 DIAGNOSIS — Z79899 Other long term (current) drug therapy: Secondary | ICD-10-CM

## 2024-07-10 DIAGNOSIS — Z01812 Encounter for preprocedural laboratory examination: Secondary | ICD-10-CM

## 2024-07-10 DIAGNOSIS — I483 Typical atrial flutter: Secondary | ICD-10-CM

## 2024-07-10 DIAGNOSIS — I480 Paroxysmal atrial fibrillation: Secondary | ICD-10-CM

## 2024-07-10 NOTE — Telephone Encounter (Signed)
 Attempted x 3 to reach patient to discuss upcoming procedure. Recording stated, call cannot be completed as dialed.

## 2024-07-15 ENCOUNTER — Other Ambulatory Visit (HOSPITAL_COMMUNITY): Payer: Self-pay

## 2024-07-30 ENCOUNTER — Telehealth: Payer: Self-pay | Admitting: Cardiology

## 2024-07-30 NOTE — Telephone Encounter (Signed)
 Pt calling to make sure he is up to date with labs in regard to his upcoming ablation. Please advise.

## 2024-07-30 NOTE — Telephone Encounter (Signed)
 Advised patient that he will need updated labs before his ablation. Patient states that he will go have them drawn today.

## 2024-07-31 LAB — CBC
Hematocrit: 46.8 % (ref 37.5–51.0)
Hemoglobin: 15.5 g/dL (ref 13.0–17.7)
MCH: 30.8 pg (ref 26.6–33.0)
MCHC: 33.1 g/dL (ref 31.5–35.7)
MCV: 93 fL (ref 79–97)
Platelets: 273 10*3/uL (ref 150–450)
RBC: 5.03 x10E6/uL (ref 4.14–5.80)
RDW: 13 % (ref 11.6–15.4)
WBC: 7.7 10*3/uL (ref 3.4–10.8)

## 2024-07-31 LAB — BASIC METABOLIC PANEL WITH GFR
BUN/Creatinine Ratio: 19 (ref 10–24)
BUN: 21 mg/dL (ref 8–27)
CO2: 24 mmol/L (ref 20–29)
Calcium: 10.2 mg/dL (ref 8.6–10.2)
Chloride: 98 mmol/L (ref 96–106)
Creatinine, Ser: 1.1 mg/dL (ref 0.76–1.27)
Glucose: 112 mg/dL — ABNORMAL HIGH (ref 70–99)
Potassium: 4.2 mmol/L (ref 3.5–5.2)
Sodium: 138 mmol/L (ref 134–144)
eGFR: 72 mL/min/{1.73_m2}

## 2024-08-06 ENCOUNTER — Ambulatory Visit (HOSPITAL_COMMUNITY): Admission: RE | Admit: 2024-08-06 | Source: Home / Self Care | Admitting: Cardiology

## 2024-08-06 ENCOUNTER — Encounter (HOSPITAL_COMMUNITY): Admission: RE | Payer: Self-pay | Source: Home / Self Care

## 2024-08-06 SURGERY — ATRIAL FIBRILLATION ABLATION
Anesthesia: General
